# Patient Record
Sex: Female | Born: 1985 | Race: Black or African American | Hispanic: No | Marital: Single | Smoking: Current some day smoker
Health system: Southern US, Community
[De-identification: ages and names within clinical notes are randomized; demographics above are authoritative.]

## PROBLEM LIST (undated history)

## (undated) DIAGNOSIS — K631 Perforation of intestine (nontraumatic): Secondary | ICD-10-CM

## (undated) DIAGNOSIS — K5792 Diverticulitis of intestine, part unspecified, without perforation or abscess without bleeding: Secondary | ICD-10-CM

## (undated) HISTORY — PX: CHOLECYSTECTOMY: SHX55

## (undated) HISTORY — PX: KNEE SURGERY: SHX244

---
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
## 2008-03-23 ENCOUNTER — Emergency Department (HOSPITAL_COMMUNITY): Admission: EM | Admit: 2008-03-23 | Discharge: 2008-03-23 | Payer: Self-pay | Admitting: Emergency Medicine

## 2009-05-01 IMAGING — CR DG CHEST 2V
2 series · 2 of 2 positions shown · non-contrast
Comparison: None

CLINICAL DATA: Cough

CHEST - 2 VIEW

[view not recorded (1 of 2)]
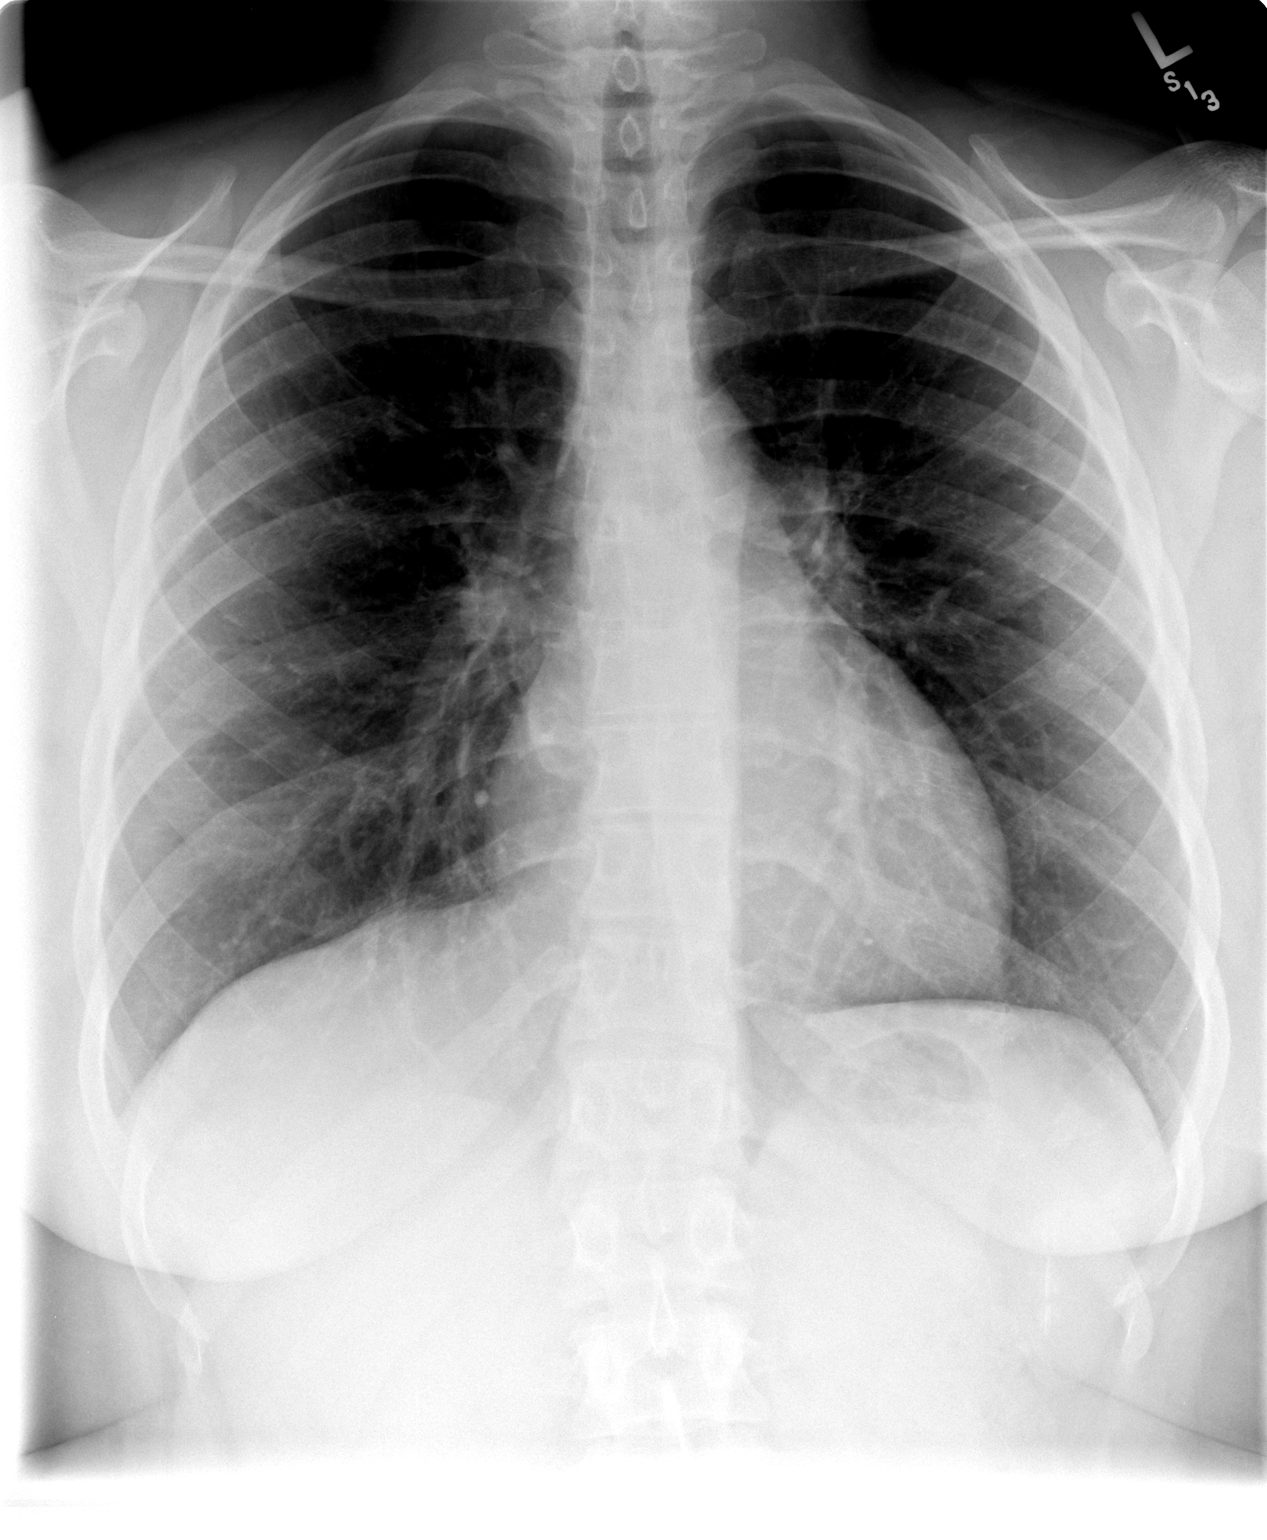

[view not recorded (2 of 2)]
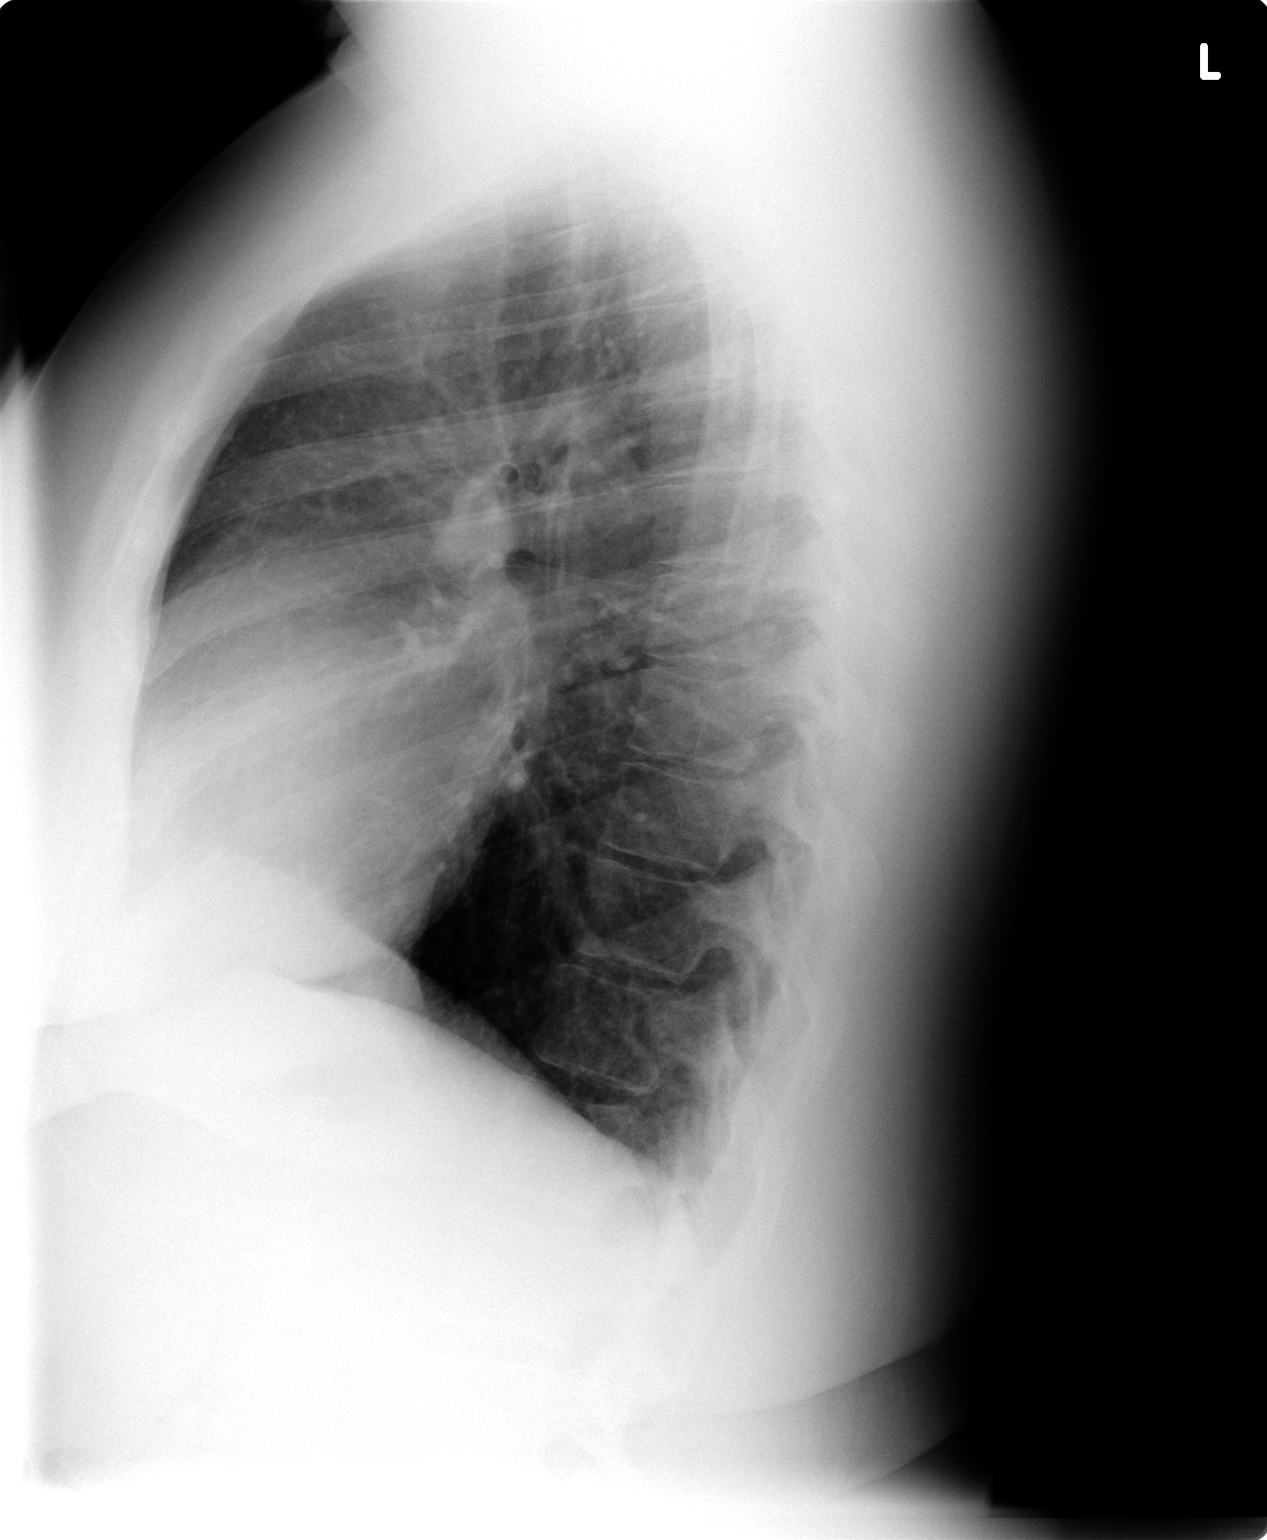

[2 of 2 positions shown; findings below may reference images not displayed]

FINDINGS: The heart size and mediastinal contours are within normal
limits.  Both lungs are clear.
IMPRESSION: No active disease.

## 2010-07-08 ENCOUNTER — Emergency Department (HOSPITAL_COMMUNITY)
Admission: EM | Admit: 2010-07-08 | Discharge: 2010-07-08 | Payer: Self-pay | Source: Home / Self Care | Admitting: Emergency Medicine

## 2010-09-30 ENCOUNTER — Emergency Department (HOSPITAL_COMMUNITY)
Admission: EM | Admit: 2010-09-30 | Discharge: 2010-09-30 | Disposition: A | Discharge status: Home / Self Care | Payer: No Typology Code available for payment source | Attending: Emergency Medicine | Admitting: Emergency Medicine

## 2010-09-30 DIAGNOSIS — Y92410 Unspecified street and highway as the place of occurrence of the external cause: Secondary | ICD-10-CM | POA: Insufficient documentation

## 2010-09-30 DIAGNOSIS — IMO0002 Reserved for concepts with insufficient information to code with codable children: Secondary | ICD-10-CM | POA: Insufficient documentation

## 2011-03-18 ENCOUNTER — Encounter: Payer: Self-pay | Admitting: Emergency Medicine

## 2011-03-18 ENCOUNTER — Emergency Department (HOSPITAL_COMMUNITY)
Admission: EM | Admit: 2011-03-18 | Discharge: 2011-03-18 | Disposition: A | Discharge status: Home / Self Care | Payer: Self-pay | Attending: Emergency Medicine | Admitting: Emergency Medicine

## 2011-03-18 DIAGNOSIS — R51 Headache: Secondary | ICD-10-CM | POA: Insufficient documentation

## 2011-03-18 DIAGNOSIS — K089 Disorder of teeth and supporting structures, unspecified: Secondary | ICD-10-CM | POA: Insufficient documentation

## 2011-03-18 DIAGNOSIS — K0889 Other specified disorders of teeth and supporting structures: Secondary | ICD-10-CM

## 2011-03-18 MED ORDER — HYDROCODONE-ACETAMINOPHEN 5-325 MG PO TABS: 2.0000 | ORAL_TABLET | ORAL | Status: AC | PRN

## 2011-03-18 MED ORDER — AMOXICILLIN 250 MG PO CAPS: 250.0000 mg | ORAL_CAPSULE | Freq: Three times a day (TID) | ORAL | Status: AC

## 2011-03-18 MED ORDER — PERMETHRIN 5 % EX CREA: TOPICAL_CREAM | CUTANEOUS | Status: AC

## 2011-03-18 NOTE — ED Notes (Signed)
Pt c/o left upper/lower toothache since yesterday

## 2011-03-18 NOTE — ED Notes (Signed)
Pt a/ox4. Resp even and unlabored. NAD at this time. D/C instructions reviewed with pt. Pt verbalized understanding. Pt ambulated with steady gate to POV. 

## 2011-03-18 NOTE — ED Provider Notes (Signed)
History     CSN: [id number] Arrival date & time: 03/18/2011 11:34 AM  Chief Complaint  Patient presents with  . Dental Pain   HPI Peggy Rodriguez is a 25 y.o. female who presents to the ED with pain in her upper and lower molars that started a few days ago and has gotten worse. She states she has never had a tooth ache before. The history was provided by the patient.  History reviewed. No pertinent past medical history.  Past Surgical History  Procedure Date  . Cholecystectomy   . Knee surgery     Family History  Problem Relation Age of Onset  . Hypertension Mother   . Coronary artery disease Mother   . Thyroid disease Mother     History  Substance Use Topics  . Smoking status: Current Everyday Smoker -- 0.5 packs/day  . Smokeless tobacco: Not on file  . Alcohol Use: No    OB History    Grav Para Term Preterm Abortions TAB SAB Ect Mult Living                  Review of Systems  Constitutional: Negative for fever, appetite change and fatigue.  HENT: Positive for facial swelling and dental problem. Negative for congestion and neck pain.   Eyes: Negative.   Respiratory: Negative for cough and wheezing.   Cardiovascular: Negative.   Gastrointestinal: Negative for nausea, vomiting and abdominal pain.  Genitourinary: Negative.   Musculoskeletal: Negative for back pain and gait problem.  Skin: Negative for rash and wound.  Neurological: Positive for headaches. Negative for dizziness.    Physical Exam  BP 140/81  Pulse 74  Temp(Src) 98.7 F (37.1 C) (Oral)  Resp 18  SpO2 100%  LMP 02/16/2011  Physical Exam  Nursing note and vitals reviewed. Constitutional: She is oriented to person, place, and time. She appears well-developed and well-nourished. No distress.  HENT:  Head: Atraumatic.  Mouth/Throat: Dental caries present. No uvula swelling. No posterior oropharyngeal erythema or tonsillar abscesses.         Tenderness in the upper and lower second and 3rd  molars with palp.  Eyes: EOM are normal.  Neck: Neck supple.  Pulmonary/Chest: Effort normal.  Abdominal: Soft. There is no tenderness.  Musculoskeletal: Normal range of motion.  Neurological: She is alert and oriented to person, place, and time. No cranial nerve deficit.  Skin: Skin is warm and dry.    ED Course  Procedures  Assessment:  Dental pain   Dental Caries  Plan:  Amoxicillin 250 mg. Po tid x 10 days   Ibuprofen prn   Hydrocodone/ace. 5/325 mg. Po q6 hours # 16   Follow up with dentist ASAP    Kerrie Buffalo, NP 03/18/11 1208  Note: Patient states her friend has just been diagnosed with scabies and pt. Is has noted a similar rash. Request cream to treat herself at the same time friend is treated. Will give Rx for [facility], NP 03/18/11 1316

## 2011-04-07 LAB — STREP A DNA PROBE: Group A Strep Probe: NEGATIVE

## 2011-04-07 LAB — RAPID STREP SCREEN (MED CTR MEBANE ONLY): Streptococcus, Group A Screen (Direct): NEGATIVE

## 2011-07-08 HISTORY — PX: CHOLECYSTECTOMY: SHX55

## 2013-05-07 HISTORY — PX: BARTHOLIN GLAND CYST EXCISION: SHX565

## 2019-04-14 DIAGNOSIS — Z114 Encounter for screening for human immunodeficiency virus [HIV]: Secondary | ICD-10-CM | POA: Diagnosis not present

## 2019-04-14 DIAGNOSIS — Z01419 Encounter for gynecological examination (general) (routine) without abnormal findings: Secondary | ICD-10-CM | POA: Diagnosis not present

## 2019-04-14 DIAGNOSIS — Z113 Encounter for screening for infections with a predominantly sexual mode of transmission: Secondary | ICD-10-CM | POA: Diagnosis not present

## 2019-04-14 DIAGNOSIS — Z0389 Encounter for observation for other suspected diseases and conditions ruled out: Secondary | ICD-10-CM | POA: Diagnosis not present

## 2019-04-14 DIAGNOSIS — Z3009 Encounter for other general counseling and advice on contraception: Secondary | ICD-10-CM | POA: Diagnosis not present

## 2019-04-14 DIAGNOSIS — Z1388 Encounter for screening for disorder due to exposure to contaminants: Secondary | ICD-10-CM | POA: Diagnosis not present

## 2019-04-20 DIAGNOSIS — Z3046 Encounter for surveillance of implantable subdermal contraceptive: Secondary | ICD-10-CM | POA: Diagnosis not present

## 2019-07-18 DIAGNOSIS — R030 Elevated blood-pressure reading, without diagnosis of hypertension: Secondary | ICD-10-CM | POA: Diagnosis not present

## 2019-07-18 DIAGNOSIS — N760 Acute vaginitis: Secondary | ICD-10-CM | POA: Diagnosis not present

## 2019-07-18 DIAGNOSIS — Z113 Encounter for screening for infections with a predominantly sexual mode of transmission: Secondary | ICD-10-CM | POA: Diagnosis not present

## 2019-07-18 DIAGNOSIS — Z3009 Encounter for other general counseling and advice on contraception: Secondary | ICD-10-CM | POA: Diagnosis not present

## 2019-07-18 DIAGNOSIS — Z3046 Encounter for surveillance of implantable subdermal contraceptive: Secondary | ICD-10-CM | POA: Diagnosis not present

## 2019-08-11 ENCOUNTER — Other Ambulatory Visit: Payer: Self-pay

## 2019-08-15 ENCOUNTER — Telehealth: Payer: Self-pay | Admitting: *Deleted

## 2019-08-15 NOTE — Telephone Encounter (Signed)
Pt notified that her covid 19 tests results are not available. She voiced understanding.

## 2019-08-19 ENCOUNTER — Telehealth: Payer: Self-pay | Admitting: General Practice

## 2019-08-19 NOTE — Telephone Encounter (Signed)
Negative COVID results given. Patient results "NOT Detected." Caller expressed understanding. ° °

## 2019-09-15 ENCOUNTER — Ambulatory Visit: Payer: Medicaid Other | Attending: Internal Medicine

## 2019-09-15 DIAGNOSIS — Z23 Encounter for immunization: Secondary | ICD-10-CM

## 2019-09-15 NOTE — Progress Notes (Signed)
   Covid-19 Vaccination Clinic  Name:  WYNDI NORTHRUP    MRN: [id number] DOB: 12-Dec-1985  09/15/2019  Ms. Sheeran was observed post Covid-19 immunization for 15 minutes without incident. She was provided with Vaccine Information Sheet and instruction to access the V-Safe system.   Ms. Weems was instructed to call 911 with any severe reactions post vaccine: Marland Kitchen Difficulty breathing  . Swelling of face and throat  . A fast heartbeat  . A bad rash all over body  . Dizziness and weakness   Immunizations Administered    Name Date Dose VIS Date Route   Moderna COVID-19 Vaccine 09/15/2019 10:19 AM 0.5 mL 06/07/2019 Intramuscular   Manufacturer: Moderna   Lot: [id number]   NDC: 34287-681-15

## 2019-09-20 ENCOUNTER — Encounter (HOSPITAL_COMMUNITY): Payer: Self-pay | Admitting: *Deleted

## 2019-09-20 ENCOUNTER — Emergency Department (HOSPITAL_COMMUNITY)
Admission: EM | Admit: 2019-09-20 | Discharge: 2019-09-20 | Disposition: A | Discharge status: Home / Self Care | Payer: Medicaid Other | Attending: Emergency Medicine | Admitting: Emergency Medicine

## 2019-09-20 ENCOUNTER — Other Ambulatory Visit: Payer: Self-pay

## 2019-09-20 DIAGNOSIS — L732 Hidradenitis suppurativa: Secondary | ICD-10-CM

## 2019-09-20 DIAGNOSIS — F17210 Nicotine dependence, cigarettes, uncomplicated: Secondary | ICD-10-CM | POA: Insufficient documentation

## 2019-09-20 MED ORDER — DOXYCYCLINE HYCLATE 100 MG PO CAPS: 100.0000 mg | ORAL_CAPSULE | Freq: Two times a day (BID) | ORAL | 0 refills | Status: DC

## 2019-09-20 MED ORDER — FLUCONAZOLE 150 MG PO TABS: 150.0000 mg | ORAL_TABLET | Freq: Every day | ORAL | 0 refills | Status: DC

## 2019-09-20 NOTE — ED Triage Notes (Signed)
C/o abscesses under both arms x 2 weeks

## 2019-09-20 NOTE — ED Notes (Signed)
Pt. States they "haven't had to shave their under arms in years".

## 2019-09-20 NOTE — Discharge Instructions (Addendum)
You were seen in the emergency department today for irritation to the area of your armpits.  We did not see an area that looked appropriate to be drained in the emergency department at this time.  We are concerned that you may have something called hidradenitis suppurativa, please see attached handout.  We are starting you on doxycycline, an antibiotic, to treat for infection.  Please take this as prescribed.  We are also sending you home with Diflucan in case she developed a yeast infection.  We have prescribed you new medication(s) today. Discuss the medications prescribed today with your pharmacist as they can have adverse effects and interactions with your other medicines including over the counter and prescribed medications. Seek medical evaluation if you start to experience new or abnormal symptoms after taking one of these medicines, seek care immediately if you start to experience difficulty breathing, feeling of your throat closing, facial swelling, or rash as these could be indications of a more serious allergic reaction  We would like you to follow-up with your primary care provider and/or a dermatologist within the next 3 to 5 days.  We have provided information for local dermatologist.  Return to the ER for new or worsening symptoms including but not limited to increased pain, spreading redness, fever, or any other concerns.  [facility] Dermatologists:   [facility]  3.2 [phone])  Dermatologist  [address] [place] # [place]  [phone]   Dr. Mertha Finders, MD  2.6 [phone])  Dermatologist  [address] [place]  [phone]  [facility]  3.5 (3)  [facility]  [address] [place]  [phone]   [facility]  4.0 (4)  Dermatologist  [address]  [phone]  Janalyn Harder MD  3.0 (2)  Dermatologist  [address]  [phone]  Hoyle Sauer  2.7 (6)  Dermatologist  [address] [place]  [phone]  [place] Amy Y  MD  2.0 (1)  Dermatologist  [address] [place]  [phone]  [facility]  5.0 (3)  Doctor  [address]  [phone]

## 2019-09-20 NOTE — ED Provider Notes (Signed)
[facility] EMERGENCY DEPARTMENT Provider Note   CSN: [id number] Arrival date & time: 09/20/19  1030     History Chief Complaint  Patient presents with  . Abscess    Peggy Rodriguez is a 35 y.o. female with a hx of tobacco abuse who presents to the ED with concern for abscesses to bilateral axillary regions developing over the past 1 week.  Patient states she has multiple areas of discomfort and firmness to the bilateral axillary region, these have developed and progressed in varying stages over the course of the past week.  No alleviating or aggravating factors.  They feel like prior abscesses she has had.  She has had recurrent issues with similar in the past.  She denies fever, chills, nausea, vomiting, or injury to the area.  HPI     History reviewed. No pertinent past medical history.  There are no problems to display for this patient.   Past Surgical History:  Procedure Laterality Date  . CHOLECYSTECTOMY    . KNEE SURGERY       OB History   No obstetric history on file.     Family History  Problem Relation Age of Onset  . Hypertension Mother   . Coronary artery disease Mother   . Thyroid disease Mother     Social History   Tobacco Use  . Smoking status: Current Some Day Smoker    Packs/day: 0.50  . Smokeless tobacco: Never Used  Substance Use Topics  . Alcohol use: No  . Drug use: Not on file    Home Medications Prior to Admission medications   Medication Sig Start Date End Date Taking? Authorizing Provider  nystatin cream (MYCOSTATIN) APPLY PEA SIZE AMOUNT ON THE SKIN 2 3 TIMES PER DAY 08/11/19   [provider]  triamcinolone cream (KENALOG) 0.1 % APPLY TOPICALLY TO AFFECTED AREA TWICE A DAY FOR 2 WEEKS 08/11/19   [provider]    Allergies    Patient has no known allergies.  Review of Systems   Review of Systems  Constitutional: Negative for chills and fever.  Respiratory: Negative for cough and shortness of breath.    Cardiovascular: Negative for chest pain.  Gastrointestinal: Negative for abdominal pain, nausea and vomiting.  Skin: Positive for wound.  Neurological: Negative for weakness and numbness.    Physical Exam Updated Vital Signs BP 113/84   Pulse 78   Temp 98.5 F (36.9 C) (Oral)   Resp 18   SpO2 99%   Physical Exam Vitals and nursing note reviewed.  Constitutional:      General: She is not in acute distress.    Appearance: She is well-developed. She is not toxic-appearing.  HENT:     Head: Normocephalic and atraumatic.  Eyes:     General:        Right eye: No discharge.        Left eye: No discharge.     Conjunctiva/sclera: Conjunctivae normal.  Cardiovascular:     Rate and Rhythm: Normal rate and regular rhythm.     Comments: 2+ symmetric radial pulses. Pulmonary:     Effort: Pulmonary effort is normal. No respiratory distress.     Breath sounds: Normal breath sounds. No wheezing, rhonchi or rales.  Abdominal:     General: There is no distension.     Palpations: Abdomen is soft.     Tenderness: There is no abdominal tenderness.  Musculoskeletal:     Cervical back: Neck supple.  Skin:  General: Skin is warm and dry.     Findings: No rash.     Comments: There are multiple small areas of induration to the bilateral axillas- variable sizes, 1-2 cm each, some with mild overlying erythema, mildly tender to palpation.  No palpable fluctuance.  No active drainage.  Neurological:     Mental Status: She is alert.     Comments: Clear speech.   Psychiatric:        Behavior: Behavior normal.     ED Results / Procedures / Treatments   Labs (all labs ordered are listed, but only abnormal results are displayed) Labs Reviewed - No data to display  EKG None  Radiology No results found.  Procedures Procedures (including critical care time)  Medications Ordered in ED Medications - No data to display  ED Course  I have reviewed the triage vital signs and the nursing  notes.  Pertinent labs & imaging results that were available during my care of the patient were reviewed by me and considered in my medical decision making (see chart for details).    MDM Rules/Calculators/A&P                      Patient presents to the emergency department with concern for bilateral axillary abscesses which have been developing over the past 1 week.  She is nontoxic, resting comfortably, vitals WNL.  She has multiple small areas of induration and tenderness to palpation, some with mild overlying erythema.  No streaking erythema.  No palpable fluctuance, utilize bedside ultrasound without obvious large drainable fluid collection amenable to emergency department incision and drainage.  Given recurrence and degree of current presentation there is concern for hidradenitis suppurativa.  Will start patient on doxycycline and provide information for dermatology follow-up.  Recommended warm compresses.  She states that she frequently gets yeast infections with doxycycline, will prescribe one-time dose of Diflucan as well. I discussed results, treatment plan, need for follow-up, and return precautions with the patient. Provided opportunity for questions, patient confirmed understanding and is in agreement with plan.   Final Clinical Impression(s) / ED Diagnoses Final diagnoses:  Hidradenitis suppurativa    Rx / DC Orders ED Discharge Orders         Ordered    doxycycline (VIBRAMYCIN) 100 MG capsule  2 times daily     09/20/19 1254    fluconazole (DIFLUCAN) 150 MG tablet  Daily     09/20/19 1254           Nycole Kawahara, Glynda Jaeger, PA-C 09/20/19 [place], Ankit, MD 09/20/19 1421

## 2019-09-27 ENCOUNTER — Ambulatory Visit: Payer: Self-pay | Admitting: Family Medicine

## 2019-10-18 ENCOUNTER — Ambulatory Visit: Payer: Medicaid Other | Attending: Internal Medicine

## 2019-10-18 DIAGNOSIS — Z23 Encounter for immunization: Secondary | ICD-10-CM

## 2019-10-18 NOTE — Progress Notes (Signed)
   Covid-19 Vaccination Clinic  Name:  Peggy Rodriguez    MRN: [id number] DOB: [birth year]  10/18/2019  [name] was observed post Covid-19 immunization for 15 minutes without incident. She was provided with Vaccine Information Sheet and instruction to access the V-Safe system.   [name] was instructed to call 911 with any severe reactions post vaccine: Marland Kitchen Difficulty breathing  . Swelling of face and throat  . A fast heartbeat  . A bad rash all over body  . Dizziness and weakness   Immunizations Administered    Name Date Dose VIS Date Route   Moderna COVID-19 Vaccine 10/18/2019 10:36 AM 0.5 mL 06/07/2019 Intramuscular   Manufacturer: Moderna   Lot: [id number]   NDC: 73532-992-42

## 2020-01-05 DIAGNOSIS — Z419 Encounter for procedure for purposes other than remedying health state, unspecified: Secondary | ICD-10-CM | POA: Diagnosis not present

## 2020-01-31 ENCOUNTER — Encounter (HOSPITAL_COMMUNITY): Payer: Self-pay | Admitting: *Deleted

## 2020-01-31 ENCOUNTER — Emergency Department (HOSPITAL_COMMUNITY)
Admission: EM | Admit: 2020-01-31 | Discharge: 2020-01-31 | Disposition: A | Discharge status: Home / Self Care | Payer: Medicaid Other | Attending: Emergency Medicine | Admitting: Emergency Medicine

## 2020-01-31 ENCOUNTER — Other Ambulatory Visit: Payer: Self-pay

## 2020-01-31 DIAGNOSIS — R109 Unspecified abdominal pain: Secondary | ICD-10-CM | POA: Insufficient documentation

## 2020-01-31 DIAGNOSIS — Z87891 Personal history of nicotine dependence: Secondary | ICD-10-CM | POA: Insufficient documentation

## 2020-01-31 DIAGNOSIS — R112 Nausea with vomiting, unspecified: Secondary | ICD-10-CM | POA: Diagnosis not present

## 2020-01-31 DIAGNOSIS — R1110 Vomiting, unspecified: Secondary | ICD-10-CM | POA: Diagnosis present

## 2020-01-31 DIAGNOSIS — R531 Weakness: Secondary | ICD-10-CM | POA: Insufficient documentation

## 2020-01-31 LAB — DIFFERENTIAL
Abs Immature Granulocytes: 0.02 10*3/uL (ref 0.00–0.07)
Basophils Absolute: 0 10*3/uL (ref 0.0–0.1)
Basophils Relative: 0 %
Eosinophils Absolute: 0 10*3/uL (ref 0.0–0.5)
Eosinophils Relative: 1 %
Immature Granulocytes: 0 %
Lymphocytes Relative: 20 %
Lymphs Abs: 1.3 10*3/uL (ref 0.7–4.0)
Monocytes Absolute: 0.4 10*3/uL (ref 0.1–1.0)
Monocytes Relative: 6 %
Neutro Abs: 4.8 10*3/uL (ref 1.7–7.7)
Neutrophils Relative %: 73 %

## 2020-01-31 LAB — URINALYSIS, ROUTINE W REFLEX MICROSCOPIC
Bilirubin Urine: NEGATIVE
Glucose, UA: NEGATIVE mg/dL
Hgb urine dipstick: NEGATIVE
Ketones, ur: 20 mg/dL — AB
Leukocytes,Ua: NEGATIVE
Nitrite: NEGATIVE
Protein, ur: NEGATIVE mg/dL
Specific Gravity, Urine: 1.019 (ref 1.005–1.030)
pH: 5 (ref 5.0–8.0)

## 2020-01-31 LAB — COMPREHENSIVE METABOLIC PANEL
ALT: 18 U/L (ref 0–44)
AST: 14 U/L — ABNORMAL LOW (ref 15–41)
Albumin: 3.9 g/dL (ref 3.5–5.0)
Alkaline Phosphatase: 67 U/L (ref 38–126)
Anion gap: 10 (ref 5–15)
BUN: 9 mg/dL (ref 6–20)
CO2: 24 mmol/L (ref 22–32)
Calcium: 8.8 mg/dL — ABNORMAL LOW (ref 8.9–10.3)
Chloride: 102 mmol/L (ref 98–111)
Creatinine, Ser: 0.62 mg/dL (ref 0.44–1.00)
GFR calc Af Amer: >60 mL/min (ref >60)
GFR calc non Af Amer: >60 mL/min (ref >60)
Glucose, Bld: 98 mg/dL (ref 70–99)
Potassium: 3.3 mmol/L — ABNORMAL LOW (ref 3.5–5.1)
Sodium: 136 mmol/L (ref 135–145)
Total Bilirubin: 0.8 mg/dL (ref 0.3–1.2)
Total Protein: 7.7 g/dL (ref 6.5–8.1)

## 2020-01-31 LAB — CBC
HCT: 40.2 % (ref 36.0–46.0)
Hemoglobin: 12.4 g/dL (ref 12.0–15.0)
MCH: 27.1 pg (ref 26.0–34.0)
MCHC: 30.8 g/dL (ref 30.0–36.0)
MCV: 88 fL (ref 80.0–100.0)
Platelets: 301 10*3/uL (ref 150–400)
RBC: 4.57 MIL/uL (ref 3.87–5.11)
RDW: 16 % — ABNORMAL HIGH (ref 11.5–15.5)
WBC: 6.5 10*3/uL (ref 4.0–10.5)
nRBC: 0 % (ref 0.0–0.2)

## 2020-01-31 LAB — LIPASE, BLOOD: Lipase: 20 U/L (ref 11–51)

## 2020-01-31 LAB — POC URINE PREG, ED: Preg Test, Ur: NEGATIVE

## 2020-01-31 MED ORDER — ONDANSETRON HCL 4 MG/2ML IJ SOLN
4.0000 mg | Freq: Once | INTRAMUSCULAR | Status: AC
  Administered 2020-01-31: 4 mg via INTRAVENOUS
  Filled 2020-01-31: qty 2

## 2020-01-31 MED ORDER — SODIUM CHLORIDE 0.9% FLUSH: 3.0000 mL | Freq: Once | INTRAVENOUS | Status: DC

## 2020-01-31 MED ORDER — ONDANSETRON HCL 4 MG PO TABS
4.0000 mg | ORAL_TABLET | Freq: Four times a day (QID) | ORAL | 0 refills | Status: DC
Start: 2020-01-31 — End: 2020-03-08

## 2020-01-31 NOTE — ED Triage Notes (Signed)
Nausea and vomitng

## 2020-01-31 NOTE — Discharge Instructions (Signed)
Your nausea and vomiting is likely related to a viral illness.  It is important that you get plenty of rest and drink small, frequent sips of clear fluids this evening.  Bland diet as tolerated starting tomorrow.  Follow-up with your primary doctor for recheck.  Return to emergency department if you develop any worsening symptoms such as fever, increasing abdominal pain or persistent vomiting or diarrhea.

## 2020-01-31 NOTE — ED Provider Notes (Signed)
[facility] EMERGENCY DEPARTMENT Provider Note   CSN: [id number] Arrival date & time: 01/31/20  1042     History Chief Complaint  Patient presents with  . Emesis  . Diarrhea    Peggy Rodriguez is a 34 y.o. female.  HPI      Peggy Rodriguez is a 33 y.o. female who presents to the Emergency Department complaining of sudden onset of nausea vomiting since Sunday.  Reports multiple episodes of vomiting and unable to tolerate food or liquids.  Symptoms are associated with generalized body aches and weakness.  States she feels dehydrated.  Symptoms began after babysitting for her niece who has similar symptoms.  She also reports some crampy lower abdominal pain associated with vomiting only.  No fever or chills, denies dysuria, chest pain, shortness of breath, diarrhea.  She denies known Covid exposures states she has had both vaccines.  History reviewed. No pertinent past medical history.  There are no problems to display for this patient.   Past Surgical History:  Procedure Laterality Date  . CHOLECYSTECTOMY    . KNEE SURGERY       OB History   No obstetric history on file.     Family History  Problem Relation Age of Onset  . Hypertension Mother   . Coronary artery disease Mother   . Thyroid disease Mother     Social History   Tobacco Use  . Smoking status: Former Smoker    Packs/day: 0.50  . Smokeless tobacco: Never Used  Substance Use Topics  . Alcohol use: No  . Drug use: Not Currently    Home Medications Prior to Admission medications   Medication Sig Start Date End Date Taking? Authorizing Provider  doxycycline (VIBRAMYCIN) 100 MG capsule Take 1 capsule (100 mg total) by mouth 2 (two) times daily. 09/20/19   Petrucelli, Samantha R, PA-C  fluconazole (DIFLUCAN) 150 MG tablet Take 1 tablet (150 mg total) by mouth daily. Take 1 tablet as needed for yeast infection symptoms 09/20/19   Petrucelli, Lelon Mast R, PA-C  nystatin cream (MYCOSTATIN) APPLY PEA SIZE AMOUNT  ON THE SKIN 2 3 TIMES PER DAY 08/11/19   [provider]  triamcinolone cream (KENALOG) 0.1 % APPLY TOPICALLY TO AFFECTED AREA TWICE A DAY FOR 2 WEEKS 08/11/19   [provider]    Allergies    Patient has no known allergies.  Review of Systems   Review of Systems  Constitutional: Positive for appetite change. Negative for chills and fever.  HENT: Negative for sore throat and trouble swallowing.   Respiratory: Negative for chest tightness and shortness of breath.   Cardiovascular: Negative for chest pain.  Gastrointestinal: Positive for abdominal pain, nausea and vomiting. Negative for blood in stool and diarrhea.  Genitourinary: Negative for decreased urine volume, difficulty urinating, dysuria and flank pain.  Musculoskeletal: Negative for back pain.  Skin: Negative for color change and rash.  Neurological: Positive for weakness. Negative for dizziness, syncope, facial asymmetry, numbness and headaches.  Hematological: Negative for adenopathy.    Physical Exam Updated Vital Signs BP 125/76 (BP Location: Right Arm)   Pulse 87   Temp 99.5 F (37.5 C) (Oral)   Resp 16   Ht 5\' 10"  (1.778 m)   Wt (!) 140.2 kg   SpO2 100%   BMI 44.34 kg/m   Physical Exam Vitals and nursing note reviewed.  Constitutional:      General: She is not in acute distress.    Appearance: Normal appearance.  HENT:     Mouth/Throat:     Pharynx: No oropharyngeal exudate or posterior oropharyngeal erythema.     Comments: Mucous membranes are slightly dry. Eyes:     Conjunctiva/sclera: Conjunctivae normal.     Pupils: Pupils are equal, round, and reactive to light.  Cardiovascular:     Rate and Rhythm: Normal rate and regular rhythm.     Pulses: Normal pulses.  Pulmonary:     Effort: Pulmonary effort is normal.     Breath sounds: Normal breath sounds.  Abdominal:     General: There is no distension.     Palpations: Abdomen is soft.     Tenderness: There is no abdominal tenderness.  There is no right CVA tenderness, left CVA tenderness or guarding.  Musculoskeletal:     Cervical back: Normal range of motion. No tenderness.     Right lower leg: No edema.     Left lower leg: No edema.  Lymphadenopathy:     Cervical: No cervical adenopathy.  Skin:    General: Skin is warm.     Capillary Refill: Capillary refill takes less than 2 seconds.     Findings: No erythema or rash.  Neurological:     General: No focal deficit present.     Mental Status: She is alert.     Sensory: No sensory deficit.     Motor: No weakness.     ED Results / Procedures / Treatments   Labs (all labs ordered are listed, but only abnormal results are displayed) Labs Reviewed  COMPREHENSIVE METABOLIC PANEL - Abnormal; Notable for the following components:      Result Value   Potassium 3.3 (*)    Calcium 8.8 (*)    AST 14 (*)    All other components within normal limits  CBC - Abnormal; Notable for the following components:   RDW 16.0 (*)    All other components within normal limits  URINALYSIS, ROUTINE W REFLEX MICROSCOPIC - Abnormal; Notable for the following components:   Ketones, ur 20 (*)    All other components within normal limits  LIPASE, BLOOD  DIFFERENTIAL  POC URINE PREG, ED    EKG None  Radiology No results found.  Procedures Procedures (including critical care time)  Medications Ordered in ED Medications  sodium chloride flush (NS) 0.9 % injection 3 mL (has no administration in time range)  ondansetron (ZOFRAN) injection 4 mg (4 mg Intravenous Given 01/31/20 1507)    ED Course  I have reviewed the triage vital signs and the nursing notes.  Pertinent labs & imaging results that were available during my care of the patient were reviewed by me and considered in my medical decision making (see chart for details).  Clinical Course as of Jan 31 1647  Tue Jan 31, 2020  1347 Hemoglobin: 12.4 [AS]    Clinical Course User Index [AS] Ardith Dark    MDM Rules/Calculators/A&P                          On recheck, patient reports feeling better.  No further vomiting after antiemetic and IV normal saline.  Abdomen is soft and nontender on exam.  no peritoneal signs.  Doubt acute surgical abdomen.  Symptoms likely related to viral illness.  Vitals reviewed by me and reassuring.  On recheck, patient has tolerated oral fluid challenge and reports feeling better and ready for discharge home.  Laboratory studies are reassuring and unremarkable.  Patient had recent sick contact, symptoms likely viral.  She was given strict return precautions and agrees to plan.   Final Clinical Impression(s) / ED Diagnoses Final diagnoses:  Non-intractable vomiting with nausea, unspecified vomiting type    Rx / DC Orders ED Discharge Orders    None       Pauline Aus, PA-C 01/31/20 1705    Blane Ohara, MD 02/01/20 1540

## 2020-02-05 DIAGNOSIS — Z419 Encounter for procedure for purposes other than remedying health state, unspecified: Secondary | ICD-10-CM | POA: Diagnosis not present

## 2020-02-26 ENCOUNTER — Other Ambulatory Visit: Payer: Self-pay

## 2020-02-26 ENCOUNTER — Emergency Department (HOSPITAL_COMMUNITY): Payer: Medicaid Other

## 2020-02-26 ENCOUNTER — Inpatient Hospital Stay (HOSPITAL_COMMUNITY)
Admission: EM | Admit: 2020-02-26 | Discharge: 2020-02-29 | DRG: 392 | Disposition: A | Discharge status: Home / Self Care | Payer: Medicaid Other | Attending: Internal Medicine | Admitting: Internal Medicine

## 2020-02-26 ENCOUNTER — Encounter (HOSPITAL_COMMUNITY): Payer: Self-pay | Admitting: *Deleted

## 2020-02-26 DIAGNOSIS — Z20822 Contact with and (suspected) exposure to covid-19: Secondary | ICD-10-CM | POA: Diagnosis present

## 2020-02-26 DIAGNOSIS — Z8249 Family history of ischemic heart disease and other diseases of the circulatory system: Secondary | ICD-10-CM

## 2020-02-26 DIAGNOSIS — K5720 Diverticulitis of large intestine with perforation and abscess without bleeding: Principal | ICD-10-CM | POA: Diagnosis present

## 2020-02-26 DIAGNOSIS — R630 Anorexia: Secondary | ICD-10-CM | POA: Diagnosis present

## 2020-02-26 DIAGNOSIS — R198 Other specified symptoms and signs involving the digestive system and abdomen: Secondary | ICD-10-CM

## 2020-02-26 DIAGNOSIS — L539 Erythematous condition, unspecified: Secondary | ICD-10-CM | POA: Diagnosis not present

## 2020-02-26 DIAGNOSIS — L732 Hidradenitis suppurativa: Secondary | ICD-10-CM

## 2020-02-26 DIAGNOSIS — Z6841 Body Mass Index (BMI) 40.0 and over, adult: Secondary | ICD-10-CM | POA: Diagnosis not present

## 2020-02-26 DIAGNOSIS — E876 Hypokalemia: Secondary | ICD-10-CM | POA: Diagnosis not present

## 2020-02-26 DIAGNOSIS — K5792 Diverticulitis of intestine, part unspecified, without perforation or abscess without bleeding: Secondary | ICD-10-CM

## 2020-02-26 DIAGNOSIS — E860 Dehydration: Secondary | ICD-10-CM | POA: Diagnosis not present

## 2020-02-26 DIAGNOSIS — Z9049 Acquired absence of other specified parts of digestive tract: Secondary | ICD-10-CM

## 2020-02-26 DIAGNOSIS — Z87891 Personal history of nicotine dependence: Secondary | ICD-10-CM

## 2020-02-26 DIAGNOSIS — K219 Gastro-esophageal reflux disease without esophagitis: Secondary | ICD-10-CM | POA: Diagnosis not present

## 2020-02-26 DIAGNOSIS — Z8349 Family history of other endocrine, nutritional and metabolic diseases: Secondary | ICD-10-CM | POA: Diagnosis not present

## 2020-02-26 DIAGNOSIS — R1110 Vomiting, unspecified: Secondary | ICD-10-CM | POA: Diagnosis not present

## 2020-02-26 DIAGNOSIS — K5730 Diverticulosis of large intestine without perforation or abscess without bleeding: Secondary | ICD-10-CM | POA: Diagnosis not present

## 2020-02-26 DIAGNOSIS — K631 Perforation of intestine (nontraumatic): Secondary | ICD-10-CM | POA: Diagnosis not present

## 2020-02-26 DIAGNOSIS — E66813 Obesity, class 3: Secondary | ICD-10-CM | POA: Diagnosis present

## 2020-02-26 DIAGNOSIS — K5900 Constipation, unspecified: Secondary | ICD-10-CM | POA: Diagnosis not present

## 2020-02-26 DIAGNOSIS — Z79899 Other long term (current) drug therapy: Secondary | ICD-10-CM | POA: Diagnosis not present

## 2020-02-26 DIAGNOSIS — E871 Hypo-osmolality and hyponatremia: Secondary | ICD-10-CM | POA: Diagnosis not present

## 2020-02-26 LAB — COMPREHENSIVE METABOLIC PANEL
ALT: 15 U/L (ref 0–44)
AST: 12 U/L — ABNORMAL LOW (ref 15–41)
Albumin: 3.7 g/dL (ref 3.5–5.0)
Alkaline Phosphatase: 64 U/L (ref 38–126)
Anion gap: 10 (ref 5–15)
BUN: 5 mg/dL — ABNORMAL LOW (ref 6–20)
CO2: 21 mmol/L — ABNORMAL LOW (ref 22–32)
Calcium: 8.8 mg/dL — ABNORMAL LOW (ref 8.9–10.3)
Chloride: 102 mmol/L (ref 98–111)
Creatinine, Ser: 0.59 mg/dL (ref 0.44–1.00)
GFR calc Af Amer: >60 mL/min (ref >60)
GFR calc non Af Amer: >60 mL/min (ref >60)
Glucose, Bld: 103 mg/dL — ABNORMAL HIGH (ref 70–99)
Potassium: 3.2 mmol/L — ABNORMAL LOW (ref 3.5–5.1)
Sodium: 133 mmol/L — ABNORMAL LOW (ref 135–145)
Total Bilirubin: 1.1 mg/dL (ref 0.3–1.2)
Total Protein: 7.3 g/dL (ref 6.5–8.1)

## 2020-02-26 LAB — URINALYSIS, ROUTINE W REFLEX MICROSCOPIC
Bacteria, UA: NONE SEEN
Bilirubin Urine: NEGATIVE
Glucose, UA: NEGATIVE mg/dL
Ketones, ur: 20 mg/dL — AB
Leukocytes,Ua: NEGATIVE
Nitrite: NEGATIVE
Protein, ur: NEGATIVE mg/dL
Specific Gravity, Urine: 1.009 (ref 1.005–1.030)
pH: 7 (ref 5.0–8.0)

## 2020-02-26 LAB — SARS CORONAVIRUS 2 BY RT PCR (HOSPITAL ORDER, PERFORMED IN ~~LOC~~ HOSPITAL LAB): SARS Coronavirus 2: NEGATIVE

## 2020-02-26 LAB — CBC WITH DIFFERENTIAL/PLATELET
Abs Immature Granulocytes: 0.05 10*3/uL (ref 0.00–0.07)
Basophils Absolute: 0 10*3/uL (ref 0.0–0.1)
Basophils Relative: 0 %
Eosinophils Absolute: 0 10*3/uL (ref 0.0–0.5)
Eosinophils Relative: 0 %
HCT: 36.9 % (ref 36.0–46.0)
Hemoglobin: 11.5 g/dL — ABNORMAL LOW (ref 12.0–15.0)
Immature Granulocytes: 0 %
Lymphocytes Relative: 15 %
Lymphs Abs: 2.4 10*3/uL (ref 0.7–4.0)
MCH: 27.1 pg (ref 26.0–34.0)
MCHC: 31.2 g/dL (ref 30.0–36.0)
MCV: 86.8 fL (ref 80.0–100.0)
Monocytes Absolute: 1.2 10*3/uL — ABNORMAL HIGH (ref 0.1–1.0)
Monocytes Relative: 8 %
Neutro Abs: 11.9 10*3/uL — ABNORMAL HIGH (ref 1.7–7.7)
Neutrophils Relative %: 77 %
Platelets: 309 10*3/uL (ref 150–400)
RBC: 4.25 MIL/uL (ref 3.87–5.11)
RDW: 15.9 % — ABNORMAL HIGH (ref 11.5–15.5)
WBC: 15.6 10*3/uL — ABNORMAL HIGH (ref 4.0–10.5)
nRBC: 0 % (ref 0.0–0.2)

## 2020-02-26 LAB — PREGNANCY, URINE: Preg Test, Ur: NEGATIVE

## 2020-02-26 MED ORDER — PIPERACILLIN-TAZOBACTAM 3.375 G IVPB 30 MIN
3.3750 g | Freq: Once | INTRAVENOUS | Status: AC
  Administered 2020-02-26: 3.375 g via INTRAVENOUS
  Filled 2020-02-26: qty 50

## 2020-02-26 MED ORDER — IBUPROFEN 800 MG PO TABS
800.0000 mg | ORAL_TABLET | Freq: Once | ORAL | Status: AC
  Administered 2020-02-26: 800 mg via ORAL
  Filled 2020-02-26: qty 1

## 2020-02-26 MED ORDER — HEPARIN SODIUM (PORCINE) 5000 UNIT/ML IJ SOLN
5000.0000 [IU] | Freq: Three times a day (TID) | INTRAMUSCULAR | Status: DC
  Administered 2020-02-26 – 2020-02-29 (×6): 5000 [IU] via SUBCUTANEOUS
  Filled 2020-02-26 (×8): qty 1

## 2020-02-26 MED ORDER — ONDANSETRON HCL 4 MG/2ML IJ SOLN
4.0000 mg | Freq: Four times a day (QID) | INTRAMUSCULAR | Status: DC | PRN
  Administered 2020-02-26 – 2020-02-28 (×4): 4 mg via INTRAVENOUS
  Filled 2020-02-26 (×4): qty 2

## 2020-02-26 MED ORDER — SODIUM CHLORIDE 0.9 % IV SOLN: INTRAVENOUS | Status: DC

## 2020-02-26 MED ORDER — MORPHINE SULFATE (PF) 2 MG/ML IV SOLN
2.0000 mg | INTRAVENOUS | Status: DC | PRN
  Administered 2020-02-26 – 2020-02-28 (×11): 2 mg via INTRAVENOUS
  Filled 2020-02-26 (×12): qty 1

## 2020-02-26 MED ORDER — POTASSIUM CHLORIDE 2 MEQ/ML IV SOLN: INTRAVENOUS | Status: DC

## 2020-02-26 MED ORDER — PANTOPRAZOLE SODIUM 40 MG IV SOLR
40.0000 mg | INTRAVENOUS | Status: DC
  Administered 2020-02-26 – 2020-02-28 (×3): 40 mg via INTRAVENOUS
  Filled 2020-02-26 (×4): qty 40

## 2020-02-26 MED ORDER — ONDANSETRON HCL 4 MG/2ML IJ SOLN
4.0000 mg | Freq: Once | INTRAMUSCULAR | Status: AC
  Administered 2020-02-26: 4 mg via INTRAVENOUS
  Filled 2020-02-26: qty 2

## 2020-02-26 MED ORDER — PIPERACILLIN-TAZOBACTAM 3.375 G IVPB
3.3750 g | Freq: Three times a day (TID) | INTRAVENOUS | Status: DC
  Administered 2020-02-26 – 2020-02-29 (×9): 3.375 g via INTRAVENOUS
  Filled 2020-02-26 (×10): qty 50

## 2020-02-26 MED ORDER — IOHEXOL 300 MG/ML  SOLN
100.0000 mL | Freq: Once | INTRAMUSCULAR | Status: AC | PRN
  Administered 2020-02-26: 100 mL via INTRAVENOUS

## 2020-02-26 MED ORDER — KCL IN DEXTROSE-NACL 40-5-0.45 MEQ/L-%-% IV SOLN
INTRAVENOUS | Status: DC
  Filled 2020-02-26 (×3): qty 1000

## 2020-02-26 MED ORDER — SODIUM CHLORIDE 0.9 % IV SOLN: INTRAVENOUS | Status: AC

## 2020-02-26 NOTE — H&P (Signed)
History and Physical    Peggy Rodriguez NGE:[id number] DOB: 1986/04/27 DOA: 02/26/2020  PCP: [facility]  Patient coming from: Home  I have personally briefly reviewed patient's old medical records in [facility]  Chief Complaint: Abdominal pain, nausea and vomiting  HPI: Peggy Rodriguez is a 34 y.o. female with medical history significant of with past medical history significant for prior history of cholecystectomy, knee osteoarthritis and morbid obesity; who presented to the hospital secondary to abdominal pain, nausea and vomiting.  Patient reports symptoms started approximately 3-4 days and worsening.  She expressed pain in the lower aspect of her abdomen, 7-8 out of 10 in intensity, crampy in nature, associated with poor appetite, nausea, dry heaving/vomiting and constipation.  Patient reports pain is radiated to the lower aspect of her left back. Patient denies dysuria, hematuria, melena, hematochezia, hematemesis, fever, chills, headaches, focal neurologic deficits or any other complaints.  No sick contacts.  Covid test check in the ED and negative.  ED Course: CT abdomen and pelvis demonstrated sigmoid diverticulitis with contained perforation, mild hyponatremia/hypokalemia and elevated WBCs appreciated on the blood work.  Patient received IV fluids, IV antibiotics [facility] has been contacted for further evaluation and management.  General surgery service was also called at time of admission.  Review of Systems: As per HPI otherwise all other systems reviewed and are negative.   History reviewed. No pertinent past medical history.  Past Surgical History:  Procedure Laterality Date  . CHOLECYSTECTOMY    . KNEE SURGERY      Social History  reports that she has quit smoking. She smoked 0.50 packs per day. She has never used smokeless tobacco. She reports previous drug use. She reports that she does not drink alcohol.  No Known Allergies  Family History    Problem Relation Age of Onset  . Hypertension Mother   . Coronary artery disease Mother   . Thyroid disease Mother     Prior to Admission medications   Medication Sig Start Date End Date Taking? Authorizing Provider  etonogestrel (NEXPLANON) 68 MG IMPL implant 1 each by Subdermal route once.   Yes [provider]  ibuprofen (ADVIL) 800 MG tablet Take 800 mg by mouth every 6 (six) hours as needed.   Yes [provider]  naproxen sodium (ALEVE) 220 MG tablet Take 220 mg by mouth daily as needed.   Yes [provider]  doxycycline (VIBRAMYCIN) 100 MG capsule Take 1 capsule (100 mg total) by mouth 2 (two) times daily. Patient not taking: Reported on 02/26/2020 09/20/19   Petrucelli, Pleas Koch, PA-C  fluconazole (DIFLUCAN) 150 MG tablet Take 1 tablet (150 mg total) by mouth daily. Take 1 tablet as needed for yeast infection symptoms Patient not taking: Reported on 02/26/2020 09/20/19   Petrucelli, Lelon Mast R, PA-C  nystatin cream (MYCOSTATIN) APPLY PEA SIZE AMOUNT ON THE SKIN 2 3 TIMES PER DAY Patient not taking: Reported on 02/26/2020 08/11/19   [provider]  ondansetron (ZOFRAN) 4 MG tablet Take 1 tablet (4 mg total) by mouth every 6 (six) hours. As needed for nausea vomiting Patient not taking: Reported on 02/26/2020 01/31/20   Triplett, Tammy, PA-C  triamcinolone cream (KENALOG) 0.1 % APPLY TOPICALLY TO AFFECTED AREA TWICE A DAY FOR 2 WEEKS Patient not taking: Reported on 02/26/2020 08/11/19   [provider]    Physical Exam: Vitals:   02/26/20 1400 02/26/20 1430 02/26/20 1528 02/26/20 1529  BP: 113/64 121/68 125/82   Pulse:  81 (!) 101 94   Resp:  18 19   Temp:   98.8 F (37.1 C)   TempSrc:   Oral   SpO2: 97% 100% 95%   Weight:    134.6 kg  Height:    5\' 11"  (1.803 m)    Constitutional: Afebrile, no chest pain, no SOB. No major distress.  Vitals:   02/26/20 1400 02/26/20 1430 02/26/20 1528 02/26/20 1529  BP: 113/64 121/68 125/82   Pulse:  81 (!) 101 94   Resp:  18 19   Temp:   98.8 F (37.1 C)   TempSrc:   Oral   SpO2: 97% 100% 95%   Weight:    134.6 kg  Height:    5\' 11"  (1.803 m)   Eyes: PERRL, lids and conjunctivae normal, no icterus, no nystagmus. ENMT: Mucous membranes are moist. Posterior pharynx clear of any exudate or lesions. Neck: normal, supple, no masses, no thyromegaly Respiratory: clear to auscultation bilaterally, no wheezing, no crackles. Normal respiratory effort. No accessory muscle use.  Good oxygen saturation on room air. Cardiovascular: Borderline sinus tachycardia, no rubs, no gallops, no murmurs, no JVD.   Abdomen: Tender to palpation in her left lower quadrant, no guarding, soft, positive bowel sounds.  No distention appreciated.   Musculoskeletal: no clubbing / cyanosis. No joint deformity upper and lower extremities. Good ROM, no contractures. Normal muscle tone.  Skin: no rashes, no petechiae. Neurologic: CN 2-12 grossly intact. Sensation intact, DTR normal. Strength 5/5 in all 4.  Psychiatric: Normal judgment and insight. Alert and oriented x 3. Normal mood.    Labs on Admission: I have personally reviewed following labs and imaging studies  CBC: Recent Labs  Lab 02/26/20 0839  WBC 15.6*  NEUTROABS 11.9*  HGB 11.5*  HCT 36.9  MCV 86.8  PLT 309    Basic Metabolic Panel: Recent Labs  Lab 02/26/20 0839  NA 133*  K 3.2*  CL 102  CO2 21*  GLUCOSE 103*  BUN 5*  CREATININE 0.59  CALCIUM 8.8*    GFR: Estimated Creatinine Clearance: 152.1 mL/min (by C-G formula based on SCr of 0.59 mg/dL).  Liver Function Tests: Recent Labs  Lab 02/26/20 0839  AST 12*  ALT 15  ALKPHOS 64  BILITOT 1.1  PROT 7.3  ALBUMIN 3.7    Urine analysis:    Component Value Date/Time   COLORURINE YELLOW 02/26/2020 0754   APPEARANCEUR CLEAR 02/26/2020 0754   LABSPEC 1.009 02/26/2020 0754   PHURINE 7.0 02/26/2020 0754   GLUCOSEU NEGATIVE 02/26/2020 0754   HGBUR SMALL (A) 02/26/2020 0754    BILIRUBINUR NEGATIVE 02/26/2020 0754   KETONESUR 20 (A) 02/26/2020 0754   PROTEINUR NEGATIVE 02/26/2020 0754   NITRITE NEGATIVE 02/26/2020 0754   LEUKOCYTESUR NEGATIVE 02/26/2020 0754    Radiological Exams on Admission: CT ABDOMEN PELVIS W CONTRAST  Result Date: 02/26/2020 CLINICAL DATA:  Lower abdominal pain with swelling and constipation. Nausea and vomiting for 3 days. EXAM: CT ABDOMEN AND PELVIS WITH CONTRAST TECHNIQUE: Multidetector CT imaging of the abdomen and pelvis was performed using the standard protocol following bolus administration of intravenous contrast. CONTRAST:  02/28/2020 OMNIPAQUE IOHEXOL 300 MG/ML  SOLN COMPARISON:  None. FINDINGS: Lower chest:  No contributory findings. Hepatobiliary: No focal liver abnormality.Cholecystectomy. Pancreas: Unremarkable. Spleen: Unremarkable. Adrenals/Urinary Tract: Negative adrenals. No hydronephrosis or stone. Unremarkable bladder. Stomach/Bowel: Left colonic diverticulosis with inflammation around a sigmoid diverticulum, complicated by extraluminal gas with a 17 mm span. No obstruction. No appendicitis. Vascular/Lymphatic: No acute vascular  abnormality. No mass or adenopathy. Reproductive:No pathologic findings. Other: No ascites or pneumoperitoneum. Musculoskeletal: No acute abnormalities. Lower lumbar facet degeneration. IMPRESSION: Sigmoid diverticulitis complicated by contained perforation (local mesenteric gas without fluid collection). Electronically Signed   By: Marnee Spring M.D.   On: 02/26/2020 09:46    Assessment/Plan 1-diverticulitis of colon with perforation -N.p.o. for bowel rest. -Started on IV Zosyn -Fluid resuscitation, antiemetics and as needed analgesics. -Replete electrolytes -Follow recommendation by general surgery -Continue supportive care and follow clinical response.  2-dehydration, hyponatremia, hypokalemia -In the setting of GI losses and poor oral intake with acute diverticulitis process. -Continue fluid  resuscitation -Follow electrolytes trend and replete as needed.  3-Obesity, Class III, BMI 40-49.9 (morbid obesity) (HCC) -Low calorie diet, portion control and increase physical activity discussed with patient -Body mass index is 41.39 kg/m.  4-GERD (gastroesophageal reflux disease) -will use PPI  5-Leukocytosis -due to stress demargination and acute diverticulitis process -Continue current antibiotics and fluid resuscitation. -Repeat CBC in a.m. to follow WBCs trend.  DVT prophylaxis: Lovenox Code Status:   Full code Family Communication:  No family members at bedside. Disposition Plan:   Patient is from:  Home  Anticipated DC to:  Home  Anticipated DC date:  2 to 3 days if no surgical intervention needed.  Anticipated DC barriers: Ability to tolerate oral intake and having control of her abdominal pain. Consults called:  Dr. Lovell Sheehan (general surgery). Admission status:  Inpatient, length of stay more than 2 midnights, MedSurg bed.  Severity of Illness: Moderate to severe severity; patient with acute diverticulitis and contained perforation.  Active nausea/vomiting and abdominal pain with acute dehydration/electrolyte abnormalities.  Admitted for IV antibiotics, fluid resuscitation and electrolyte repletion.  General surgery has been consulted; currently without signs of needing acute surgical intervention.    Vassie Loll MD [facility]  How to contact the [facility] Attending or Consulting provider 7A - 7P or covering provider during after hours 7P -7A, for this patient?   1. Check the care team in [facility] and look for a) attending/consulting [facility] provider listed and b) the [facility] team listed 2. Log into [email or web address] and use [facility] universal password to access. If you do not have the password, please contact the hospital operator. 3. Locate the [facility] provider you are looking for under [facility] and page to a number that you can be directly reached. 4. If you still  have difficulty reaching the provider, please page the [facility] (Director on Call) for the Hospitalists listed on amion for assistance.  02/26/2020, 4:33 PM

## 2020-02-26 NOTE — ED Triage Notes (Signed)
Pt c/o constipation that started 3 days ago, n/v that Saturday am with lower abd pain that started yesterday am,

## 2020-02-26 NOTE — ED Provider Notes (Signed)
[facility] Provider Note   CSN: [id number] Arrival date & time: 02/26/20  [id number]     History Chief Complaint  Patient presents with  . Abdominal Pain    Peggy Rodriguez is a 34 y.o. female.  HPI   34 y/o female - hx of cholecystectomy in the past On Nexplanon - has had visit several weeks ago because of nausea vomiting and diarrhea, Peggy Rodriguez presents again today with some lower abdominal discomfort without any bowel movements in several days.  Peggy Rodriguez was nauseated, the pain started a couple of days ago, it is mostly lower abdomen and mostly left lower abdomen with some radiation into the left back.  This seems to get worse when Peggy Rodriguez pushes on her abdomen, Peggy Rodriguez has no urinary symptoms other than a feeling of pressure.  Peggy Rodriguez has not had any fevers though Peggy Rodriguez does have some subjective chills.  Peggy Rodriguez still has an appendix, has never had any ovarian symptoms, does not think that Peggy Rodriguez is pregnant at all.  Peggy Rodriguez tried some Zofran that Peggy Rodriguez had leftover from her prior ER visit which is helped, Peggy Rodriguez still has pain.  History reviewed. No pertinent past medical history.  There are no problems to display for this patient.   Past Surgical History:  Procedure Laterality Date  . CHOLECYSTECTOMY    . KNEE SURGERY       OB History   No obstetric history on file.     Family History  Problem Relation Age of Onset  . Hypertension Mother   . Coronary artery disease Mother   . Thyroid disease Mother     Social History   Tobacco Use  . Smoking status: Former Smoker    Packs/day: 0.50  . Smokeless tobacco: Never Used  Substance Use Topics  . Alcohol use: No  . Drug use: Not Currently    Home Medications Prior to Admission medications   Medication Sig Start Date End Date Taking? Authorizing Provider  etonogestrel (NEXPLANON) 68 MG IMPL implant 1 each by Subdermal route once.   Yes [provider]  ibuprofen (ADVIL) 800 MG tablet Take 800 mg by mouth every 6 (six) hours as  needed.   Yes [provider]  naproxen sodium (ALEVE) 220 MG tablet Take 220 mg by mouth daily as needed.   Yes [provider]  doxycycline (VIBRAMYCIN) 100 MG capsule Take 1 capsule (100 mg total) by mouth 2 (two) times daily. Patient not taking: Reported on 02/26/2020 09/20/19   Petrucelli, Pleas KochSamantha R, PA-C  fluconazole (DIFLUCAN) 150 MG tablet Take 1 tablet (150 mg total) by mouth daily. Take 1 tablet as needed for yeast infection symptoms Patient not taking: Reported on 02/26/2020 09/20/19   Petrucelli, Lelon MastSamantha R, PA-C  nystatin cream (MYCOSTATIN) APPLY PEA SIZE AMOUNT ON THE SKIN 2 3 TIMES PER DAY Patient not taking: Reported on 02/26/2020 08/11/19   [provider]  ondansetron (ZOFRAN) 4 MG tablet Take 1 tablet (4 mg total) by mouth every 6 (six) hours. As needed for nausea vomiting Patient not taking: Reported on 02/26/2020 01/31/20   Triplett, Tammy, PA-C  triamcinolone cream (KENALOG) 0.1 % APPLY TOPICALLY TO AFFECTED AREA TWICE A DAY FOR 2 WEEKS Patient not taking: Reported on 02/26/2020 08/11/19   [provider]    Allergies    Patient has no known allergies.  Review of Systems   Review of Systems  All other systems reviewed and are negative.   Physical Exam Updated Vital Signs BP 131/80  Pulse 86   Temp 99.5 F (37.5 C) (Oral)   Resp 20   Ht 1.778 m (5\' 10" )   Wt (!) 140 kg   SpO2 100%   BMI 44.29 kg/m   Physical Exam Vitals and nursing note reviewed.  Constitutional:      General: Peggy Rodriguez is not in acute distress.    Appearance: Peggy Rodriguez is well-developed.  HENT:     Head: Normocephalic and atraumatic.     Mouth/Throat:     Mouth: Mucous membranes are dry.     Pharynx: No oropharyngeal exudate.  Eyes:     General: No scleral icterus.       Right eye: No discharge.        Left eye: No discharge.     Conjunctiva/sclera: Conjunctivae normal.     Pupils: Pupils are equal, round, and reactive to light.  Neck:     Thyroid: No  thyromegaly.     Vascular: No JVD.  Cardiovascular:     Rate and Rhythm: Normal rate and regular rhythm.     Heart sounds: Normal heart sounds. No murmur heard.  No friction rub. No gallop.   Pulmonary:     Effort: Pulmonary effort is normal. No respiratory distress.     Breath sounds: Normal breath sounds. No wheezing or rales.  Abdominal:     General: Bowel sounds are normal. There is no distension.     Palpations: Abdomen is soft. There is no mass.     Tenderness: There is abdominal tenderness.     Comments: The patient has focal tenderness in the left lower quadrant as well as the midline lower abdomen.  There is no upper abdominal tenderness, Peggy Rodriguez is very soft diffusely, there is no peritoneal signs or guarding.  There is no CVA tenderness bilaterally, there is no tenderness at McBurney's point  Musculoskeletal:        General: No tenderness. Normal range of motion.     Cervical back: Normal range of motion and neck supple.  Lymphadenopathy:     Cervical: No cervical adenopathy.  Skin:    General: Skin is warm and dry.     Findings: No erythema or rash.  Neurological:     Mental Status: Peggy Rodriguez is alert.     Coordination: Coordination normal.  Psychiatric:        Behavior: Behavior normal.     ED Results / Procedures / Treatments   Labs (all labs ordered are listed, but only abnormal results are displayed) Labs Reviewed  URINALYSIS, ROUTINE W REFLEX MICROSCOPIC - Abnormal; Notable for the following components:      Result Value   Hgb urine dipstick SMALL (*)    Ketones, ur 20 (*)    All other components within normal limits  CBC WITH DIFFERENTIAL/PLATELET - Abnormal; Notable for the following components:   WBC 15.6 (*)    Hemoglobin 11.5 (*)    RDW 15.9 (*)    Neutro Abs 11.9 (*)    Monocytes Absolute 1.2 (*)    All other components within normal limits  COMPREHENSIVE METABOLIC PANEL - Abnormal; Notable for the following components:   Sodium 133 (*)    Potassium 3.2  (*)    CO2 21 (*)    Glucose, Bld 103 (*)    BUN 5 (*)    Calcium 8.8 (*)    AST 12 (*)    All other components within normal limits  PREGNANCY, URINE    EKG None  Radiology CT  ABDOMEN PELVIS W CONTRAST  Result Date: 02/26/2020 CLINICAL DATA:  Lower abdominal pain with swelling and constipation. Nausea and vomiting for 3 days. EXAM: CT ABDOMEN AND PELVIS WITH CONTRAST TECHNIQUE: Multidetector CT imaging of the abdomen and pelvis was performed using the standard protocol following bolus administration of intravenous contrast. CONTRAST:  OMNIPAQUE IOHEXOL 300 MG/ML  SOLN COMPARISON:  None. FINDINGS: Lower chest:  No contributory findings. Hepatobiliary: No focal liver abnormality.Cholecystectomy. Pancreas: Unremarkable. Spleen: Unremarkable. Adrenals/Urinary Tract: Negative adrenals. No hydronephrosis or stone. Unremarkable bladder. Stomach/Bowel: Left colonic diverticulosis with inflammation around a sigmoid diverticulum, complicated by extraluminal gas with a 17 mm span. No obstruction. No appendicitis. Vascular/Lymphatic: No acute vascular abnormality. No mass or adenopathy. Reproductive:No pathologic findings. Other: No ascites or pneumoperitoneum. Musculoskeletal: No acute abnormalities. Lower lumbar facet degeneration. IMPRESSION: Sigmoid diverticulitis complicated by contained perforation (local mesenteric gas without fluid collection). Electronically Signed   By: Marnee Spring M.D.   On: 02/26/2020 09:46    Procedures .Critical Care Performed by: Eber Hong, MD Authorized by: Eber Hong, MD   Critical care provider statement:    Critical care time (minutes):  35   Critical care time was exclusive of:  Separately billable procedures and treating other patients and teaching time   Critical care was necessary to treat or prevent imminent or life-threatening deterioration of the following conditions: Perforated large intestine.   Critical care was time spent personally by  me on the following activities:  Blood draw for specimens, development of treatment plan with patient or surrogate, discussions with consultants, evaluation of patient's response to treatment, examination of patient, obtaining history from patient or surrogate, ordering and performing treatments and interventions, ordering and review of laboratory studies, ordering and review of radiographic studies, pulse oximetry, re-evaluation of patient's condition and review of old charts   (including critical care time)  Medications Ordered in ED Medications  0.9 %  sodium chloride infusion ( Intravenous New Bag/Given 02/26/20 0812)  piperacillin-tazobactam (ZOSYN) IVPB 3.375 g (has no administration in time range)  ibuprofen (ADVIL) tablet 800 mg (800 mg Oral Given 02/26/20 0812)  ondansetron (ZOFRAN) injection 4 mg (4 mg Intravenous Given 02/26/20 0812)  iohexol (OMNIPAQUE) 300 MG/ML solution 100 mL (100 mLs Intravenous Contrast Given 02/26/20 4742)    ED Course  I have reviewed the triage vital signs and the nursing notes.  Pertinent labs & imaging results that were available during my care of the patient were reviewed by me and considered in my medical decision making (see chart for details).  Clinical Course as of Feb 25 954  Wynelle Link Feb 26, 2020  5956 UA with ketones suggestive of some dehydration - no glucose.  Pt does endorse smoking MJ, denies ETOH or tobacco.   [BM]  [id number] Laboratory work-up shows a leukocytosis of 15,600, renal function is preserved with normal creatinine, electrolytes are just off baseline with a sodium of 133 and a potassium of 3.2.   [BM]  0953 CT scan unfortunately shows that there has been a contained perforation of the sigmoid colon with diverticulitis.  General surgery Dr. Lovell Sheehan will be paged though this does not appear acutely surgical.  Antibiotics ordered and hospitalist will be consulted for admission   [BM]    Clinical Course User Index [BM] Eber Hong, MD    MDM Rules/Calculators/A&P                          This patient is not tachycardic on  my exam, Peggy Rodriguez does have a temperature of 99.5 and initial vital signs revealed a pulse of 110.  Given her dehydrated appearance, IV fluids will be initiated.  A CT scan will be ordered to rule out diverticulitis or complicating conditions.  Labs including a CBC, CMP and a urinalysis will also be ordered as well as a urine pregnancy.  The patient is agreeable to the plan agrees to ibuprofen but does not want anything stronger at this time.  IV antiemetics will be given in the form of ondansetron.  Rule out diverticulitis, aneurysm, ovarian cyst, kidney stone.  Patient agreeable to the plan  This patient has had a perforated viscus, antibiotics ordered  Discussed the care with Dr. Lovell Sheehan who will see the patient in consultation, agreeable that this is nonsurgical at this time  Discussed the case with Dr. Gwenlyn Perking of the hospitalist service who agrees with antibiotics, bowel rest and admitting the patient to the hospital.  Final Clinical Impression(s) / ED Diagnoses Final diagnoses:  Acute diverticulitis  Perforated viscus    Rx / DC Orders ED Discharge Orders    None       Eber Hong, MD 02/26/20 1008

## 2020-02-27 DIAGNOSIS — L732 Hidradenitis suppurativa: Secondary | ICD-10-CM

## 2020-02-27 DIAGNOSIS — K5792 Diverticulitis of intestine, part unspecified, without perforation or abscess without bleeding: Secondary | ICD-10-CM

## 2020-02-27 DIAGNOSIS — K5720 Diverticulitis of large intestine with perforation and abscess without bleeding: Secondary | ICD-10-CM | POA: Diagnosis not present

## 2020-02-27 LAB — BASIC METABOLIC PANEL
Anion gap: 9 (ref 5–15)
BUN: 5 mg/dL — ABNORMAL LOW (ref 6–20)
CO2: 23 mmol/L (ref 22–32)
Calcium: 8.3 mg/dL — ABNORMAL LOW (ref 8.9–10.3)
Chloride: 102 mmol/L (ref 98–111)
Creatinine, Ser: 0.67 mg/dL (ref 0.44–1.00)
GFR calc Af Amer: >60 mL/min (ref >60)
GFR calc non Af Amer: >60 mL/min (ref >60)
Glucose, Bld: 97 mg/dL (ref 70–99)
Potassium: 3.1 mmol/L — ABNORMAL LOW (ref 3.5–5.1)
Sodium: 134 mmol/L — ABNORMAL LOW (ref 135–145)

## 2020-02-27 LAB — CBC
HCT: 35.5 % — ABNORMAL LOW (ref 36.0–46.0)
Hemoglobin: 11.1 g/dL — ABNORMAL LOW (ref 12.0–15.0)
MCH: 27.1 pg (ref 26.0–34.0)
MCHC: 31.3 g/dL (ref 30.0–36.0)
MCV: 86.8 fL (ref 80.0–100.0)
Platelets: 303 10*3/uL (ref 150–400)
RBC: 4.09 MIL/uL (ref 3.87–5.11)
RDW: 16 % — ABNORMAL HIGH (ref 11.5–15.5)
WBC: 16.1 10*3/uL — ABNORMAL HIGH (ref 4.0–10.5)
nRBC: 0 % (ref 0.0–0.2)

## 2020-02-27 LAB — LACTIC ACID, PLASMA: Lactic Acid, Venous: 0.7 mmol/L (ref 0.5–1.9)

## 2020-02-27 LAB — HIV ANTIBODY (ROUTINE TESTING W REFLEX): HIV Screen 4th Generation wRfx: NONREACTIVE

## 2020-02-27 MED ORDER — ACETAMINOPHEN 325 MG PO TABS
650.0000 mg | ORAL_TABLET | Freq: Four times a day (QID) | ORAL | Status: DC | PRN
  Administered 2020-02-27: 650 mg via ORAL
  Filled 2020-02-27: qty 2

## 2020-02-27 MED ORDER — OXYCODONE HCL 5 MG PO TABS
5.0000 mg | ORAL_TABLET | ORAL | Status: DC | PRN
  Administered 2020-02-28 (×3): 5 mg via ORAL
  Administered 2020-02-29 (×2): 10 mg via ORAL
  Filled 2020-02-27 (×2): qty 2
  Filled 2020-02-27: qty 1
  Filled 2020-02-27: qty 2
  Filled 2020-02-27: qty 1

## 2020-02-27 NOTE — Consult Note (Addendum)
I was present with the medical student for this service. I personally verified the history of present illness, performed the physical exam, and made the plan for this encounter. I have verified the medical student's documentation and made modifications where appropriately. I have personally documented in my own words a brief history, physical, and plan below.     [name] is a 34 yo with GERD, hidradenitis who presented with 3 days LLQ pain and nausea /vomiting. She reports mostly lower abdominal pain and pressure. She has not had a BM but is passing gas. She has not had any nausea today.   Grandfather with colon cancer in his 3s. No family history of IBD. No personal history of prior diverticulitis or rectal bleeding.   Soft, nondistended, tender lower abdomen, no guarding  Personally reviewed CT- diverticula throughout colon and haziness in mesentery with gas but no fluid collection   We have discussed that diverticulitis is a spectrum of disease. We discussed that it ranges from simple diverticulitis that can be treated with oral antibiotics as an outpatient to severe cases with perforations that require emergency surgery and colostomy.  We discussed that her case is more complicated due to the perforation.  We have discussed that some of the complicated cases require admission to the hospital for IV antibiotics and otherwise still require Interventional Radiology drainage of an abscess. We have discussed that rarely some people need a colostomy if their symptoms worsen.  We discussed plans for outpatient discussion and future colonoscopy.  Clear diet Roxicodone added for oral pain option  Zosyn  Hopefully ~3 days of IV antibiotics and home to complete 10  Days Augmentin   Algis Greenhouse, MD [facility] [address] [place], [place] [address] [phone] (office)     Reason for Consult: Diverticulitis with contained perforation Referring Physician:  Vassie Loll, MD  Peggy Rodriguez is a 34 y.o. African American female with 3 day history of LLQ abdominal pain, constipation, n/v, and fever.  HPI: Peggy Rodriguez reports that on Friday (8/20) she began feeling more fatigued than usual. The next morning she began having lower abdominal pressure and pain with nausea and vomiting of yellow emesis. She describes the pain at the time as crampy and most severe in her LLQ. Additionally, she noticed some swelling and warmth of her lower abdomen that has remained consistent since then. She has little to no appetite with little PO intake since Friday due to her nausea and pain. She had not had a bowel movement since Friday until early this morning when she passed 3-4 small, hard stool pellets and flatus. Since admission she reports having a fever starting last night which got as high as 102 at 1 AM, but this resolved after being given tylenol.  On exam, she reports 5/10 diffuse crampy pain at rest and 10/10 sharp pain to palpation that is most severe in the LLQ. Rebound tenderness present. She was given 2 mg Morphine IV approximately 45 mins prior to exam.  She denies hematochezia, melena, hematemesis.  PMHx significant for Morbid Obesity, Hydradenitis Suppurativa, and GERD  Past Surgical History:  Procedure Laterality Date  . CHOLECYSTECTOMY    . KNEE SURGERY      Family History  Problem Relation Age of Onset  . Hypertension Mother   . Coronary artery disease Mother   . Thyroid disease Mother     Social History:  reports that she has quit smoking. She smoked 0.50 packs per day. She has  never used smokeless tobacco. She reports previous drug use. She reports that she does not drink alcohol.  Allergies: No Known Allergies  Medications:  Nexplanon 68 mg implant Ibuprofen 800 mg PO q6h PRN Aleve 220 mg PO QD PRN    Results for orders placed or performed during the hospital encounter of 02/26/20 (from the past 48 hour(s))  Urinalysis, Routine w  reflex microscopic Urine, Clean Catch     Status: Abnormal   Collection Time: 02/26/20  7:54 AM  Result Value Ref Range   Color, Urine YELLOW YELLOW   APPearance CLEAR CLEAR   Specific Gravity, Urine 1.009 1.005 - 1.030   pH 7.0 5.0 - 8.0   Glucose, UA NEGATIVE NEGATIVE mg/dL   Hgb urine dipstick SMALL (A) NEGATIVE   Bilirubin Urine NEGATIVE NEGATIVE   Ketones, ur 20 (A) NEGATIVE mg/dL   Protein, ur NEGATIVE NEGATIVE mg/dL   Nitrite NEGATIVE NEGATIVE   Leukocytes,Ua NEGATIVE NEGATIVE   RBC / HPF 0-5 0 - 5 RBC/hpf   WBC, UA 0-5 0 - 5 WBC/hpf   Bacteria, UA NONE SEEN NONE SEEN   Squamous Epithelial / LPF 0-5 0 - 5    Comment: Performed at [facility], [address], [place], [place] [address]  Pregnancy, urine     Status: None   Collection Time: 02/26/20  7:54 AM  Result Value Ref Range   Preg Test, Ur NEGATIVE NEGATIVE    Comment:        THE SENSITIVITY OF THIS METHODOLOGY IS >20 mIU/mL. Performed at [facility], [address], [place], [place] [address]   CBC with Differential/Platelet     Status: Abnormal   Collection Time: 02/26/20  8:39 AM  Result Value Ref Range   WBC 15.6 (H) 4.0 - 10.5 K/uL   RBC 4.25 3.87 - 5.11 MIL/uL   Hemoglobin 11.5 (L) 12.0 - 15.0 g/dL   HCT 86.5 36 - 46 %   MCV 86.8 80.0 - 100.0 fL   MCH 27.1 26.0 - 34.0 pg   MCHC 31.2 30.0 - 36.0 g/dL   RDW 78.4 (H) 69.6 - 29.5 %   Platelets 309 150 - 400 K/uL   nRBC 0.0 0.0 - 0.2 %   Neutrophils Relative % 77 %   Neutro Abs 11.9 (H) 1.7 - 7.7 K/uL   Lymphocytes Relative 15 %   Lymphs Abs 2.4 0.7 - 4.0 K/uL   Monocytes Relative 8 %   Monocytes Absolute 1.2 (H) 0 - 1 K/uL   Eosinophils Relative 0 %   Eosinophils Absolute 0.0 0 - 0 K/uL   Basophils Relative 0 %   Basophils Absolute 0.0 0 - 0 K/uL   Immature Granulocytes 0 %   Abs Immature Granulocytes 0.05 0.00 - 0.07 K/uL    Comment: Performed at [facility], [address], [place], [place] [address]  Comprehensive metabolic panel     Status:  Abnormal   Collection Time: 02/26/20  8:39 AM  Result Value Ref Range   Sodium 133 (L) 135 - 145 mmol/L   Potassium 3.2 (L) 3.5 - 5.1 mmol/L   Chloride 102 98 - 111 mmol/L   CO2 21 (L) 22 - 32 mmol/L   Glucose, Bld 103 (H) 70 - 99 mg/dL    Comment: Glucose reference range applies only to samples taken after fasting for at least 8 hours.   BUN 5 (L) 6 - 20 mg/dL   Creatinine, Ser 2.44 0.44 - 1.00 mg/dL   Calcium 8.8 (  L) 8.9 - 10.3 mg/dL   Total Protein 7.3 6.5 - 8.1 g/dL   Albumin 3.7 3.5 - 5.0 g/dL   AST 12 (L) 15 - 41 U/L   ALT 15 0 - 44 U/L   Alkaline Phosphatase 64 38 - 126 U/L   Total Bilirubin 1.1 0.3 - 1.2 mg/dL   GFR calc non Af Amer >60 >60 mL/min   GFR calc Af Amer >60 >60 mL/min   Anion gap 10 5 - 15    Comment: Performed at [facility], [address] [place], [place] [address]  HIV Antibody (routine testing w rflx)     Status: None   Collection Time: 02/26/20  8:40 AM  Result Value Ref Range   HIV Screen 4th Generation wRfx Non Reactive Non Reactive    Comment: Performed at [facility], [address] [place], [place] [address]  SARS Coronavirus 2 by RT PCR (hospital order, performed in [facility] hospital lab) Nasopharyngeal Nasopharyngeal Swab     Status: None   Collection Time: 02/26/20 10:07 AM   Specimen: Nasopharyngeal Swab  Result Value Ref Range   SARS Coronavirus 2 NEGATIVE NEGATIVE    Comment: (NOTE) SARS-CoV-2 target nucleic acids are NOT DETECTED.  The SARS-CoV-2 RNA is generally detectable in upper and lower respiratory specimens during the acute phase of infection. The lowest concentration of SARS-CoV-2 viral copies this assay can detect is 250 copies / mL. A negative result does not preclude SARS-CoV-2 infection and should not be used as the sole basis for treatment or other patient management decisions.  A negative result may occur with improper specimen collection / handling, submission of specimen other than nasopharyngeal swab,  presence of viral mutation(s) within the areas targeted by this assay, and inadequate number of viral copies (<250 copies / mL). A negative result must be combined with clinical observations, patient history, and epidemiological information.  Fact Sheet for Patients:   BoilerBrush.com.cy  Fact Sheet for Healthcare Providers: https://pope.com/  This test is not yet approved or  cleared by the [place] FDA and has been authorized for detection and/or diagnosis of SARS-CoV-2 by FDA under an Emergency Use Authorization (EUA).  This EUA will remain in effect (meaning this test can be used) for the duration of the COVID-19 declaration under Section 564(b)(1) of the Act, 21 U.S.C. section 360bbb-3(b)(1), unless the authorization is terminated or revoked sooner.  Performed at [facility], [address] [place], [place] [address]   CBC     Status: Abnormal   Collection Time: 02/27/20  1:44 AM  Result Value Ref Range   WBC 16.1 (H) 4.0 - 10.5 K/uL   RBC 4.09 3.87 - 5.11 MIL/uL   Hemoglobin 11.1 (L) 12.0 - 15.0 g/dL   HCT 58.5 (L) 36 - 46 %   MCV 86.8 80.0 - 100.0 fL   MCH 27.1 26.0 - 34.0 pg   MCHC 31.3 30.0 - 36.0 g/dL   RDW 27.7 (H) 82.4 - 23.5 %   Platelets 303 150 - 400 K/uL   nRBC 0.0 0.0 - 0.2 %    Comment: Performed at [facility], [address] [place], [place] [address]  Basic metabolic panel     Status: Abnormal   Collection Time: 02/27/20  1:44 AM  Result Value Ref Range   Sodium 134 (L) 135 - 145 mmol/L   Potassium 3.1 (L) 3.5 - 5.1 mmol/L   Chloride 102 98 - 111 mmol/L   CO2 23 22 -  32 mmol/L   Glucose, Bld 97 70 - 99 mg/dL    Comment: Glucose reference range applies only to samples taken after fasting for at least 8 hours.   BUN 5 (L) 6 - 20 mg/dL   Creatinine, Ser 9.620.67 0.44 - 1.00 mg/dL   Calcium 8.3 (L) 8.9 - 10.3 mg/dL   GFR calc non Af Amer >60 >60 mL/min   GFR calc Af Amer >60 >60 mL/min   Anion gap 9 5  - 15    Comment: Performed at [facility], [address] [place], [place] [address]  Culture, blood (routine x 2)     Status: None (Preliminary result)   Collection Time: 02/27/20  1:44 AM   Specimen: Left Antecubital; Blood  Result Value Ref Range   Specimen Description LEFT ANTECUBITAL    Special Requests      BOTTLES DRAWN AEROBIC AND ANAEROBIC Blood Culture adequate volume Performed at [facility], [address] [place], [place] [address]    Culture PENDING    Report Status PENDING   Culture, blood (routine x 2)     Status: None (Preliminary result)   Collection Time: 02/27/20  1:55 AM   Specimen: BLOOD LEFT HAND  Result Value Ref Range   Specimen Description BLOOD LEFT HAND    Special Requests      BOTTLES DRAWN AEROBIC AND ANAEROBIC Blood Culture adequate volume Performed at [facility], [address] [place], [place] [address]    Culture PENDING    Report Status PENDING   Lactic acid, plasma     Status: None   Collection Time: 02/27/20  8:47 AM  Result Value Ref Range   Lactic Acid, Venous 0.7 0.5 - 1.9 mmol/L    Comment: Performed at [facility], [address] [place], [place] [address]    CT ABDOMEN PELVIS W CONTRAST  Result Date: 02/26/2020 CLINICAL DATA:  Lower abdominal pain with swelling and constipation. Nausea and vomiting for 3 days. EXAM: CT ABDOMEN AND PELVIS WITH CONTRAST TECHNIQUE: Multidetector CT imaging of the abdomen and pelvis was performed using the standard protocol following bolus administration of intravenous contrast. CONTRAST:  100mL OMNIPAQUE IOHEXOL 300 MG/ML  SOLN COMPARISON:  None. FINDINGS: Lower chest:  No contributory findings. Hepatobiliary: No focal liver abnormality.Cholecystectomy. Pancreas: Unremarkable. Spleen: Unremarkable. Adrenals/Urinary Tract: Negative adrenals. No hydronephrosis or stone. Unremarkable bladder. Stomach/Bowel: Left colonic diverticulosis with inflammation around a sigmoid diverticulum, complicated by  extraluminal gas with a 17 mm span. No obstruction. No appendicitis. Vascular/Lymphatic: No acute vascular abnormality. No mass or adenopathy. Reproductive:No pathologic findings. Other: No ascites or pneumoperitoneum. Musculoskeletal: No acute abnormalities. Lower lumbar facet degeneration. IMPRESSION: Sigmoid diverticulitis complicated by contained perforation (local mesenteric gas without fluid collection). Electronically Signed   By: Marnee SpringJonathon  Watts M.D.   On: 02/26/2020 09:46    ROS:  Pertinent items are noted in HPI.  Blood pressure 109/63, pulse 88, temperature 99.5 F (37.5 C), temperature source Oral, resp. rate 18, height 5\' 11"  (1.803 m), weight 134.6 kg, SpO2 100 %. Physical Exam Constitutional: no acute distress, resting comfortably in bed Head: atraumatic, normocephalic Cardiovascular: regular rate and rhythm, no M/R/G Pulmonary: effort normal, normal breath sounds bilaterally Abdominal: mildly distended; TTP in all quadrants, most severe LLQ; rebound tenderness Skin: warm and dry Neurological: alert, no focal deficit Psychiatric: normal mood and affect   Assessment/Plan:  Arvilla MeresJessica S. Depriest is a 34 year old woman with a past medical history significant for morbid obesity who presented with 3 days  of worsening lower abdominal pain and swelling, n/v, and fever who was admitted for diverticulitis with contained perforation.  Diverticulitis with Contained Perforation Worsening LLQ abdominal pain, rebound tenderness, anorexia, n/v, fever, and leukocytosis to 15.6 which has increased slightly to 16.1 today. She denies any recent frank hematochezia or melena; however, hemoglobin is 11.1 today from 12.4 on 7/27. CT abdomen with contrast confirmed sigmoid diverticulitis with perforation contained within the mesentery. She has received IV NS at 250 mL/hr, Zosyn 3.375 g q8hr started yesterday. Pain moderately well controlled with 2 mg IV morphine every 3 hours. Pt is hemodynamically stable  on appropriate antibiotic therapy. Will continue to watch for improvement in pain and stable vital signs. Consider surgical intervention if worsening uncontrolled pain or hemodynamic instability develop. -NPO for bowel rest -D5, 1/2 NS 75 cc/hr -cont IV Zosyn 3.375 g q8h, Day 2 -cont IV Morphine 2 mg q3h PRN -cont Tylenol 650 mg q6h PRN for fever   Nonah Mattes 02/27/2020, 12:28 PM

## 2020-02-27 NOTE — Progress Notes (Signed)
PROGRESS NOTE    Peggy Rodriguez  WTU:[id number] DOB: Jul 08, 1985 DOA: 02/26/2020 PCP: Health, [facility] (   Chief Complaint  Patient presents with  . Abdominal Pain    Brief Narrative: Peggy Rodriguez is a 34 y.o. female with medical history significant of with past medical history significant for prior history of cholecystectomy, knee osteoarthritis and morbid obesity; who presented to the hospital secondary to abdominal pain, nausea and vomiting.  Patient reports symptoms started approximately 3-4 days and worsening.  She expressed pain in the lower aspect of her abdomen, 7-8 out of 10 in intensity, crampy in nature, associated with poor appetite, nausea, dry heaving/vomiting and constipation.  Patient reports pain is radiated to the lower aspect of her left back. Patient denies dysuria, hematuria, melena, hematochezia, hematemesis, fever, chills, headaches, focal neurologic deficits or any other complaints.  No sick contacts.  Covid test check in the ED and negative.  ED Course: CT abdomen and pelvis demonstrated sigmoid diverticulitis with contained perforation, mild hyponatremia/hypokalemia and elevated WBCs appreciated on the blood work.  Patient received IV fluids, IV antibiotics [facility] has been contacted for further evaluation and management.  General surgery service was also called at time of admission.   Assessment & Plan: 1-sigmoid diverticulitis with contained perforation -Still having abdominal pain, spiking fever and with elevated WBCs. -Continue bowel rest -Continue IV antibiotics-continue as needed antiemetics and analgesics. -Follow electrolytes and further replete as needed -Continue IV fluids. -Follow general surgery recommendation.  2-dehydration, hyponatremia, hypokalemia -Continue IV fluids as mentioned above -Continue replete electrolytes and follow trend.  3-obesity, class III -Body mass index is 41.39 kg/m. -Low calorie diet, portion control  and increase physical activity discussed with patient.  4-gastroesophageal reflux disease -Continue PPI.  5-leukocytosis -in the setting of stress demargination and #1 -continue IVF's -continue IV antibiotics -follow WBC's trend   DVT prophylaxis: Heparin Code Status: Full code Family Communication: Mom over the phone. Disposition:   Status is: Inpatient  Dispo: The patient is from: home              Anticipated d/c is to: home              Anticipated d/c date is:1-2 days if continue to improved and not surgical intervention needed.              Patient currently no medically stable for discharge, still complaining of left lower quadrant pain, spiking fever and with WBCs going the wrong direction.  For now continue conservative management and IV antibiotics.  Will follow general surgery recommendation for repeat images and diet advanced purposes.       Consultants:   General surgery   Procedures:  See below for x-ray reports   Antimicrobials:  Zosyn 02/26/20   Subjective: T-max 101.6; still complaining of pain in her left lower quadrant.  Reports no nausea and no vomiting.  Objective: Vitals:   02/27/20 0104 02/27/20 0222 02/27/20 0447 02/27/20 1400  BP: 128/74  109/63 124/75  Pulse: (!) 110  88 90  Resp: 18  18 20   Temp: (!) 101.6 F (38.7 C) 100.2 F (37.9 C) 99.5 F (37.5 C) 98.4 F (36.9 C)  TempSrc: Oral Oral Oral Oral  SpO2: 100%  100%   Weight:      Height:        Intake/Output Summary (Last 24 hours) at 02/27/2020 1751 Last data filed at 02/27/2020 0300 Gross per 24 hour  Intake 311.99 ml  Output --  Net 311.99 ml   Filed Weights   02/26/20 0741 02/26/20 1529  Weight: (!) 140 kg 134.6 kg    Examination:  General exam: Appears calm and in no acute distress; patient is spiked fever with a T-max of 101.6.  Still having left lower quadrant pain with noticeable increased in her WBCs.  No further nausea or vomiting.  Reports feeling  better. Respiratory system: Clear to auscultation. Respiratory effort normal. Cardiovascular system: S1 & S2 heard, RRR. No JVD, murmurs, rubs, gallops or clicks. No pedal edema. Gastrointestinal system: Abdomen is obese, nondistended, with positive left lower quadrant pain and mild rebound; no guarding.  Positive bowel sounds.   Central nervous system: Alert and oriented. No focal neurological deficits. Extremities: No cyanosis or clubbing. Skin: No rash, no petechiae. Psychiatry: Judgement and insight appear normal. Mood & affect appropriate.     Data Reviewed: I have personally reviewed following labs and imaging studies  CBC: Recent Labs  Lab 02/26/20 0839 02/27/20 0144  WBC 15.6* 16.1*  NEUTROABS 11.9*  --   HGB 11.5* 11.1*  HCT 36.9 35.5*  MCV 86.8 86.8  PLT 309 303    Basic Metabolic Panel: Recent Labs  Lab 02/26/20 0839 02/27/20 0144  NA 133* 134*  K 3.2* 3.1*  CL 102 102  CO2 21* 23  GLUCOSE 103* 97  BUN 5* 5*  CREATININE 0.59 0.67  CALCIUM 8.8* 8.3*    GFR: Estimated Creatinine Clearance: 152.1 mL/min (by C-G formula based on SCr of 0.67 mg/dL).  Liver Function Tests: Recent Labs  Lab 02/26/20 0839  AST 12*  ALT 15  ALKPHOS 64  BILITOT 1.1  PROT 7.3  ALBUMIN 3.7    CBG: No results for input(s): GLUCAP in the last 168 hours.   Recent Results (from the past 240 hour(s))  SARS Coronavirus 2 by RT PCR (hospital order, performed in [facility] hospital lab) Nasopharyngeal Nasopharyngeal Swab     Status: None   Collection Time: 02/26/20 10:07 AM   Specimen: Nasopharyngeal Swab  Result Value Ref Range Status   SARS Coronavirus 2 NEGATIVE NEGATIVE Final    Comment: (NOTE) SARS-CoV-2 target nucleic acids are NOT DETECTED.  The SARS-CoV-2 RNA is generally detectable in upper and lower respiratory specimens during the acute phase of infection. The lowest concentration of SARS-CoV-2 viral copies this assay can detect is 250 copies / mL. A negative  result does not preclude SARS-CoV-2 infection and should not be used as the sole basis for treatment or other patient management decisions.  A negative result may occur with improper specimen collection / handling, submission of specimen other than nasopharyngeal swab, presence of viral mutation(s) within the areas targeted by this assay, and inadequate number of viral copies (<250 copies / mL). A negative result must be combined with clinical observations, patient history, and epidemiological information.  Fact Sheet for Patients:   BoilerBrush.com.cy  Fact Sheet for Healthcare Providers: https://pope.com/  This test is not yet approved or  cleared by the [place] FDA and has been authorized for detection and/or diagnosis of SARS-CoV-2 by FDA under an Emergency Use Authorization (EUA).  This EUA will remain in effect (meaning this test can be used) for the duration of the COVID-19 declaration under Section 564(b)(1) of the Act, 21 U.S.C. section 360bbb-3(b)(1), unless the authorization is terminated or revoked sooner.  Performed at [facility], [address], [place], [place] [address]   Culture, blood (routine x 2)     Status: None (Preliminary result)  Collection Time: 02/27/20  1:44 AM   Specimen: Left Antecubital; Blood  Result Value Ref Range Status   Specimen Description LEFT ANTECUBITAL  Final   Special Requests   Final    BOTTLES DRAWN AEROBIC AND ANAEROBIC Blood Culture adequate volume Performed at [facility], [address], [place], [place] [address]    Culture PENDING  Incomplete   Report Status PENDING  Incomplete  Culture, blood (routine x 2)     Status: None (Preliminary result)   Collection Time: 02/27/20  1:55 AM   Specimen: BLOOD LEFT HAND  Result Value Ref Range Status   Specimen Description BLOOD LEFT HAND  Final   Special Requests   Final    BOTTLES DRAWN AEROBIC AND ANAEROBIC Blood Culture adequate  volume Performed at [facility], [address] [place], [place] [address]    Culture PENDING  Incomplete   Report Status PENDING  Incomplete      Radiology Studies: CT ABDOMEN PELVIS W CONTRAST  Result Date: 02/26/2020 CLINICAL DATA:  Lower abdominal pain with swelling and constipation. Nausea and vomiting for 3 days. EXAM: CT ABDOMEN AND PELVIS WITH CONTRAST TECHNIQUE: Multidetector CT imaging of the abdomen and pelvis was performed using the standard protocol following bolus administration of intravenous contrast. CONTRAST:  OMNIPAQUE IOHEXOL 300 MG/ML  SOLN COMPARISON:  None. FINDINGS: Lower chest:  No contributory findings. Hepatobiliary: No focal liver abnormality.Cholecystectomy. Pancreas: Unremarkable. Spleen: Unremarkable. Adrenals/Urinary Tract: Negative adrenals. No hydronephrosis or stone. Unremarkable bladder. Stomach/Bowel: Left colonic diverticulosis with inflammation around a sigmoid diverticulum, complicated by extraluminal gas with a 17 mm span. No obstruction. No appendicitis. Vascular/Lymphatic: No acute vascular abnormality. No mass or adenopathy. Reproductive:No pathologic findings. Other: No ascites or pneumoperitoneum. Musculoskeletal: No acute abnormalities. Lower lumbar facet degeneration. IMPRESSION: Sigmoid diverticulitis complicated by contained perforation (local mesenteric gas without fluid collection). Electronically Signed   By: Marnee Spring M.D.   On: 02/26/2020 09:46     Scheduled Meds: . heparin injection (subcutaneous)  5,000 Units Subcutaneous Q8H  . pantoprazole (PROTONIX) IV  40 mg Intravenous Q24H   Continuous Infusions: . dextrose 5 % and 0.45 % NaCl with KCl 40 mEq/L 75 mL/hr at 02/27/20 1245  . piperacillin-tazobactam 3.375 g (02/27/20 1408)     LOS: 1 day    Time spent: 35 minutes.    Vassie Loll, MD [facility]   To contact the attending provider between 7A-7P or the covering provider during after hours 7P-7A, please  log into the web site [email or web address] and access using universal [place] password for that web site. If you do not have the password, please call the hospital operator.  02/27/2020, 5:51 PM

## 2020-02-28 DIAGNOSIS — E876 Hypokalemia: Secondary | ICD-10-CM

## 2020-02-28 LAB — MAGNESIUM: Magnesium: 2.3 mg/dL (ref 1.7–2.4)

## 2020-02-28 LAB — CBC WITH DIFFERENTIAL/PLATELET
Abs Immature Granulocytes: 0.02 10*3/uL (ref 0.00–0.07)
Basophils Absolute: 0 10*3/uL (ref 0.0–0.1)
Basophils Relative: 0 %
Eosinophils Absolute: 0.3 10*3/uL (ref 0.0–0.5)
Eosinophils Relative: 4 %
HCT: 37.3 % (ref 36.0–46.0)
Hemoglobin: 11.4 g/dL — ABNORMAL LOW (ref 12.0–15.0)
Immature Granulocytes: 0 %
Lymphocytes Relative: 29 %
Lymphs Abs: 2.5 10*3/uL (ref 0.7–4.0)
MCH: 27 pg (ref 26.0–34.0)
MCHC: 30.6 g/dL (ref 30.0–36.0)
MCV: 88.2 fL (ref 80.0–100.0)
Monocytes Absolute: 0.7 10*3/uL (ref 0.1–1.0)
Monocytes Relative: 9 %
Neutro Abs: 5 10*3/uL (ref 1.7–7.7)
Neutrophils Relative %: 58 %
Platelets: 344 10*3/uL (ref 150–400)
RBC: 4.23 MIL/uL (ref 3.87–5.11)
RDW: 15.8 % — ABNORMAL HIGH (ref 11.5–15.5)
WBC: 8.5 10*3/uL (ref 4.0–10.5)
nRBC: 0 % (ref 0.0–0.2)

## 2020-02-28 LAB — BASIC METABOLIC PANEL
Anion gap: 10 (ref 5–15)
BUN: <5 mg/dL — ABNORMAL LOW (ref 6–20)
CO2: 26 mmol/L (ref 22–32)
Calcium: 8.6 mg/dL — ABNORMAL LOW (ref 8.9–10.3)
Chloride: 98 mmol/L (ref 98–111)
Creatinine, Ser: 0.63 mg/dL (ref 0.44–1.00)
GFR calc Af Amer: >60 mL/min (ref >60)
GFR calc non Af Amer: >60 mL/min (ref >60)
Glucose, Bld: 95 mg/dL (ref 70–99)
Potassium: 3.5 mmol/L (ref 3.5–5.1)
Sodium: 134 mmol/L — ABNORMAL LOW (ref 135–145)

## 2020-02-28 MED ORDER — DOCUSATE SODIUM 100 MG PO CAPS
100.0000 mg | ORAL_CAPSULE | Freq: Two times a day (BID) | ORAL | Status: DC
  Administered 2020-02-28: 100 mg via ORAL
  Filled 2020-02-28 (×3): qty 1

## 2020-02-28 NOTE — Progress Notes (Signed)
Patient diet will be further advance in the morning and if tolerated for breakfast/lunch will be able to be discharged home on oral antibiotics.

## 2020-02-28 NOTE — Progress Notes (Signed)
PROGRESS NOTE    Peggy Rodriguez  PXT:[id number] DOB: Mar 26, 1986 DOA: 02/26/2020 PCP: Health, [facility]    Chief Complaint  Patient presents with  . Abdominal Pain    Brief Narrative: Peggy Rodriguez is a 34 y.o. female with medical history significant of with past medical history significant for prior history of cholecystectomy, knee osteoarthritis and morbid obesity; who presented to the hospital secondary to abdominal pain, nausea and vomiting.  Patient reports symptoms started approximately 3-4 days and worsening.  She expressed pain in the lower aspect of her abdomen, 7-8 out of 10 in intensity, crampy in nature, associated with poor appetite, nausea, dry heaving/vomiting and constipation.  Patient reports pain is radiated to the lower aspect of her left back. Patient denies dysuria, hematuria, melena, hematochezia, hematemesis, fever, chills, headaches, focal neurologic deficits or any other complaints.  No sick contacts.  Covid test check in the ED and negative.  ED Course: CT abdomen and pelvis demonstrated sigmoid diverticulitis with contained perforation, mild hyponatremia/hypokalemia and elevated WBCs appreciated on the blood work.  Patient received IV fluids, IV antibiotics [facility] has been contacted for further evaluation and management.  General surgery service was also called at time of admission.   Assessment & Plan: 1-sigmoid diverticulitis with contained perforation -Still having abdominal pain, but better; no further fever, normal WBC's today. -slowly advancing diet. -Continue current IV antibiotics and continue as needed antiemetics and analgesics. -Continue to follow electrolytes and further replete as needed. -Continue IV fluids, will adjust rate based on diet management.. -continue to Follow general surgery recommendations, no surgical intervention needed currently..  2-dehydration, hyponatremia, hypokalemia -Continue IV fluids as mentioned  above -Continue repleting electrolytes and follow trend. -IV fluid rate will be adjusted as diet is advanced.  3-obesity, class III -Body mass index is 41.39 kg/m. -Low calorie diet, portion control and increase physical activity discussed with patient.  4-gastroesophageal reflux disease -Continue PPI.  5-leukocytosis -in the setting of stress demargination and #1 -White blood cells down and within normal limits currently -Will continue IV fluids and current antibiotics.   DVT prophylaxis: Heparin Code Status: Full code Family Communication: Mom over the phone. Disposition:   Status is: Inpatient  Dispo: The patient is from: home              Anticipated d/c is to: home              Anticipated d/c date is:1-2 days if continue to improved and not surgical intervention needed.              Patient currently no medically stable for discharge, still complaining of left lower quadrant pain, spiking fever and with WBCs going the wrong direction.  For now continue conservative management and IV antibiotics.  Will follow general surgery recommendation for repeat images and diet advanced purposes.       Consultants:   General surgery   Procedures:  See below for x-ray reports   Antimicrobials:  Zosyn 02/26/20   Subjective: No fever, no chest pain, no shortness of breath, no nausea vomiting.  Still having some left lower quadrant abdominal discomfort, but patient expressed is improving and getting better.  Objective: Vitals:   02/27/20 0447 02/27/20 1400 02/27/20 2149 02/28/20 0454  BP: 109/63 124/75 119/83 109/66  Pulse: 88 90 89 78  Resp: 18 20 (!) 24 18  Temp: 99.5 F (37.5 C) 98.4 F (36.9 C) 99.9 F (37.7 C) 98.8 F (37.1 C)  TempSrc: Oral Oral  Oral Oral  SpO2: 100%  99% 100%  Weight:      Height:       No intake or output data in the 24 hours ending 02/28/20 1322 Filed Weights   02/26/20 0741 02/26/20 1529  Weight: (!) 140 kg 134.6 kg     Examination: General exam: Alert, awake, oriented x 3, feeling better today.  No further episode of fevers and expressing improvement in her left lower quadrant pain.  No nausea, no vomiting. Has so far tolerated clear liquid diet advanced last night. Respiratory system: Clear to auscultation. Respiratory effort normal.  Good oxygen saturation on room air. Cardiovascular system:RRR. No murmurs, rubs, gallops.  No JVD (even difficult to assess secondary to body habitus). Gastrointestinal system: Abdomen is nondistended, soft and without guarding; left lower quadrant tenderness to palpation appreciated on exam.  Positive bowel sounds. Central nervous system: Alert and oriented. No focal neurological deficits. Extremities: No C/C/E, +pedal pulses Skin: No rashes, lesions or ulcers Psychiatry: Judgement and insight appear normal. Mood & affect appropriate.    Data Reviewed: I have personally reviewed following labs and imaging studies  CBC: Recent Labs  Lab 02/26/20 0839 02/27/20 0144 02/28/20 0657  WBC 15.6* 16.1* 8.5  NEUTROABS 11.9*  --  5.0  HGB 11.5* 11.1* 11.4*  HCT 36.9 35.5* 37.3  MCV 86.8 86.8 88.2  PLT 309 303 344    Basic Metabolic Panel: Recent Labs  Lab 02/26/20 0839 02/27/20 0144 02/28/20 0657  NA 133* 134* 134*  K 3.2* 3.1* 3.5  CL 102 102 98  CO2 21* 23 26  GLUCOSE 103* 97 95  BUN 5* 5* <5*  CREATININE 0.59 0.67 0.63  CALCIUM 8.8* 8.3* 8.6*  MG  --   --  2.3    GFR: Estimated Creatinine Clearance: 152.1 mL/min (by C-G formula based on SCr of 0.63 mg/dL).  Liver Function Tests: Recent Labs  Lab 02/26/20 0839  AST 12*  ALT 15  ALKPHOS 64  BILITOT 1.1  PROT 7.3  ALBUMIN 3.7    CBG: No results for input(s): GLUCAP in the last 168 hours.   Recent Results (from the past 240 hour(s))  SARS Coronavirus 2 by RT PCR (hospital order, performed in [facility] hospital lab) Nasopharyngeal Nasopharyngeal Swab     Status: None   Collection Time:  02/26/20 10:07 AM   Specimen: Nasopharyngeal Swab  Result Value Ref Range Status   SARS Coronavirus 2 NEGATIVE NEGATIVE Final    Comment: (NOTE) SARS-CoV-2 target nucleic acids are NOT DETECTED.  The SARS-CoV-2 RNA is generally detectable in upper and lower respiratory specimens during the acute phase of infection. The lowest concentration of SARS-CoV-2 viral copies this assay can detect is 250 copies / mL. A negative result does not preclude SARS-CoV-2 infection and should not be used as the sole basis for treatment or other patient management decisions.  A negative result may occur with improper specimen collection / handling, submission of specimen other than nasopharyngeal swab, presence of viral mutation(s) within the areas targeted by this assay, and inadequate number of viral copies (<250 copies / mL). A negative result must be combined with clinical observations, patient history, and epidemiological information.  Fact Sheet for Patients:   BoilerBrush.com.cy  Fact Sheet for Healthcare Providers: https://pope.com/  This test is not yet approved or  cleared by the [place] FDA and has been authorized for detection and/or diagnosis of SARS-CoV-2 by FDA under an Emergency Use Authorization (EUA).  This EUA will remain  in effect (meaning this test can be used) for the duration of the COVID-19 declaration under Section 564(b)(1) of the Act, 21 U.S.C. section 360bbb-3(b)(1), unless the authorization is terminated or revoked sooner.  Performed at [facility], [address], [place], [place] [address]   Culture, blood (routine x 2)     Status: None (Preliminary result)   Collection Time: 02/27/20  1:44 AM   Specimen: Left Antecubital; Blood  Result Value Ref Range Status   Specimen Description LEFT ANTECUBITAL  Final   Special Requests   Final    BOTTLES DRAWN AEROBIC AND ANAEROBIC Blood Culture adequate volume Performed at  [facility], [address], [place], [place] [address]    Culture PENDING  Incomplete   Report Status PENDING  Incomplete  Culture, blood (routine x 2)     Status: None (Preliminary result)   Collection Time: 02/27/20  1:55 AM   Specimen: BLOOD LEFT HAND  Result Value Ref Range Status   Specimen Description BLOOD LEFT HAND  Final   Special Requests   Final    BOTTLES DRAWN AEROBIC AND ANAEROBIC Blood Culture adequate volume Performed at [facility], [address] [place], [place] [address]    Culture PENDING  Incomplete   Report Status PENDING  Incomplete      Radiology Studies: No results found.   Scheduled Meds: . heparin injection (subcutaneous)  5,000 Units Subcutaneous Q8H  . pantoprazole (PROTONIX) IV  40 mg Intravenous Q24H   Continuous Infusions: . dextrose 5 % and 0.45 % NaCl with KCl 40 mEq/L 100 mL/hr at 02/27/20 1825  . piperacillin-tazobactam 3.375 g (02/28/20 1204)     LOS: 2 days    Time spent: 30 minutes.    Vassie Loll, MD [facility]   To contact the attending provider between 7A-7P or the covering provider during after hours 7P-7A, please log into the web site [email or web address] and access using universal [place] password for that web site. If you do not have the password, please call the hospital operator.  02/28/2020, 1:22 PM

## 2020-02-28 NOTE — Progress Notes (Signed)
  Subjective: Pt reports feeling much improved today with increased energy and reduced abdominal pain. Her pain currently is 2/10 and crampy in the LLQ at rest and 3-4/10 with movement or to palpation. Received 5 mg oxycodone at 6:00 AM but no morphine since last night. She reports having a small hard bowel movement this morning and continues to pass bas. Denies a recurrence of any nausea or vomiting since yesterday. She has improved appetite this morning but had not had a chance to eat breakfast yet.  Objective: Vital signs in last 24 hours: Temp:  [98.4 F (36.9 C)-99.9 F (37.7 C)] 98.8 F (37.1 C) (08/24 0454) Pulse Rate:  [78-90] 78 (08/24 0454) Resp:  [18-24] 18 (08/24 0454) BP: (109-124)/(66-83) 109/66 (08/24 0454) SpO2:  [99 %-100 %] 100 % (08/24 0454) Last BM Date: 02/26/20  Physical Exam Constitutional: no acute distress, resting comfortably in bed Cardiovascular: regular rate and rhythm, no M/R/G Pulmonary: effort normal, normal breath sounds bilaterally Abdominal: mild tenderness to palpation LLQ, no rebound or guarding Skin: warm and dry Neurological: alert, no focal deficit Psychiatric: normal mood and affect  Lab Results:  Recent Labs    02/27/20 0144 02/28/20 0657  WBC 16.1* 8.5  HGB 11.1* 11.4*  HCT 35.5* 37.3  PLT 303 344   BMET Recent Labs    02/27/20 0144 02/28/20 0657  NA 134* 134*  K 3.1* 3.5  CL 102 98  CO2 23 26  GLUCOSE 97 95  BUN 5* <5*  CREATININE 0.67 0.63  CALCIUM 8.3* 8.6*    Anti-infectives: Anti-infectives (From admission, onward)   Start     Dose/Rate Route Frequency Ordered Stop   02/26/20 1400  piperacillin-tazobactam (ZOSYN) IVPB 3.375 g        3.375 g 12.5 mL/hr over 240 Minutes Intravenous Every 8 hours 02/26/20 1013     02/26/20 1000  piperacillin-tazobactam (ZOSYN) IVPB 3.375 g        3.375 g 100 mL/hr over 30 Minutes Intravenous  Once 02/26/20 0954 02/26/20 1125      Assessment/Plan: Pt having significant  improvements in pain, energy level, and appetite. Afebrile overnight with no tylenol. WBC down from 16.1 to 8.5 and hemodynamically stable. Pt will continue day 3 of Zosyn today and transition to PO Augmentin at discharge. Progress diet as tolerated today. -clear diet -Roxicodone q4h PRN for pain -Zosyn 3.375g q8h, Day 3, followed by PO Augmentin at discharge   LOS: 2 days    Nonah Mattes 02/28/2020

## 2020-02-29 DIAGNOSIS — R198 Other specified symptoms and signs involving the digestive system and abdomen: Secondary | ICD-10-CM | POA: Diagnosis not present

## 2020-02-29 DIAGNOSIS — K5720 Diverticulitis of large intestine with perforation and abscess without bleeding: Secondary | ICD-10-CM | POA: Diagnosis not present

## 2020-02-29 LAB — BASIC METABOLIC PANEL
Anion gap: 8 (ref 5–15)
BUN: 5 mg/dL — ABNORMAL LOW (ref 6–20)
CO2: 26 mmol/L (ref 22–32)
Calcium: 8.7 mg/dL — ABNORMAL LOW (ref 8.9–10.3)
Chloride: 100 mmol/L (ref 98–111)
Creatinine, Ser: 0.73 mg/dL (ref 0.44–1.00)
GFR calc Af Amer: >60 mL/min (ref >60)
GFR calc non Af Amer: >60 mL/min (ref >60)
Glucose, Bld: 91 mg/dL (ref 70–99)
Potassium: 3.7 mmol/L (ref 3.5–5.1)
Sodium: 134 mmol/L — ABNORMAL LOW (ref 135–145)

## 2020-02-29 LAB — CBC
HCT: 36.8 % (ref 36.0–46.0)
Hemoglobin: 11.2 g/dL — ABNORMAL LOW (ref 12.0–15.0)
MCH: 26.7 pg (ref 26.0–34.0)
MCHC: 30.4 g/dL (ref 30.0–36.0)
MCV: 87.6 fL (ref 80.0–100.0)
Platelets: 362 10*3/uL (ref 150–400)
RBC: 4.2 MIL/uL (ref 3.87–5.11)
RDW: 16.1 % — ABNORMAL HIGH (ref 11.5–15.5)
WBC: 6.5 10*3/uL (ref 4.0–10.5)
nRBC: 0 % (ref 0.0–0.2)

## 2020-02-29 LAB — MAGNESIUM: Magnesium: 2.1 mg/dL (ref 1.7–2.4)

## 2020-02-29 MED ORDER — AMOXICILLIN-POT CLAVULANATE 875-125 MG PO TABS: 1.0000 | ORAL_TABLET | Freq: Two times a day (BID) | ORAL | 0 refills | Status: DC

## 2020-02-29 MED ORDER — OXYCODONE HCL 5 MG PO TABS: 5.0000 mg | ORAL_TABLET | ORAL | 0 refills | Status: DC | PRN

## 2020-02-29 MED ORDER — AMOXICILLIN-POT CLAVULANATE 875-125 MG PO TABS
1.0000 | ORAL_TABLET | Freq: Two times a day (BID) | ORAL | Status: DC
  Administered 2020-02-29: 1 via ORAL
  Filled 2020-02-29: qty 1

## 2020-02-29 NOTE — Progress Notes (Addendum)
I was present with the medical student for this service. I personally verified the history of present illness, performed the physical exam, and made the plan for this encounter. I have verified the medical student's documentation and made modifications where appropriately. I have personally documented in my own words a brief history, physical, and plan below.     Feeling better. Tolerated diet and had BM. Localized rash but doubt it is from antibiotics.  Soft, nondistended, minimally tender  Home with oral antibiotics to complete 10 day total course Plan for outpatient visit to discuss options  Will need referral for colonoscopy given first episode. Home with some roxicodone for pain control.   Algis Greenhouse, MD [facility] [address] [place], [place] [address] [phone] (office)   Subjective: Ms. Linhart reports feeling very well this morning and is in good spirits. She reports that she no longer has pain at rest and has 1/10 dull pain to palpation of her LLQ. She had a BM yesterday that was normal in consistency and volume for her. Tolerated liquid diet well today with no nausea, vomiting, or abdominal pain.  She notes an erythematous rash on her abdomen and left leg that developed yesterday. Early this morning she reports having some generalized itchiness on her back and legs. She denies any difficulty breathing, swelling or itchiness in her mouth or throat, or changes in sensation in her mouth. She does note that she has sensitive skin and is prone to rashes. Instructed her to inform her nurse if she notices this rash worsening or begins to feel any swelling or itchiness around her mouth or throat.   Objective: Vital signs in last 24 hours: Temp:  [98 F (36.7 C)-98.5 F (36.9 C)] 98.5 F (36.9 C) (08/25 0342) Pulse Rate:  [73-81] 81 (08/25 0342) Resp:  [18-24] 18 (08/25 0342) BP: (107-120)/(64-82) 107/64 (08/25 0342) SpO2:  [100 %] 100 %  (08/25 0342) Last BM Date: 02/26/20  Intake/Output from previous day: 08/24 0701 - 08/25 0700 In: 2224.8 [P.O.:720; I.V.:1200; IV Piggyback:304.8] Out: 600 [Urine:600] Intake/Output this shift: No intake/output data recorded.  Physical Exam Constitutional: no acute distress, resting comfortably in bed Cardiovascular: regular rate and rhythm, noM/R/G Pulmonary: effort normal, normal breath sounds bilaterally Abdominal:1/10 dull pain to palpation LLQ, no pain elsewhere in the abdomen, no rebound or guarding Skin: warm and dry, nonpainful erythematous rash on abdomen and left leg, fine vesicular appearance Neurological: alert, no focal deficit Psychiatric: normal mood and affect  Lab Results:  Recent Labs    02/27/20 0144 02/28/20 0657  WBC 16.1* 8.5  HGB 11.1* 11.4*  HCT 35.5* 37.3  PLT 303 344   BMET Recent Labs    02/27/20 0144 02/28/20 0657  NA 134* 134*  K 3.1* 3.5  CL 102 98  CO2 23 26  GLUCOSE 97 95  BUN 5* <5*  CREATININE 0.67 0.63  CALCIUM 8.3* 8.6*    Anti-infectives: Anti-infectives (From admission, onward)   Start     Dose/Rate Route Frequency Ordered Stop   02/26/20 1400  piperacillin-tazobactam (ZOSYN) IVPB 3.375 g        3.375 g 12.5 mL/hr over 240 Minutes Intravenous Every 8 hours 02/26/20 1013     02/26/20 1000  piperacillin-tazobactam (ZOSYN) IVPB 3.375 g        3.375 g 100 mL/hr over 30 Minutes Intravenous  Once 02/26/20 0954 02/26/20 1125      Assessment/Plan: Pt continues to feel well and afebrile overnight. She reports having  a normal bowel movement yesterday. Her pain has improved significantly today and is requiring less pain medication. Rash as described above possibly due to contact dermatitis but cannot rule out developing penicillin allergy, will alter home abx regimen to cipro/flagyl to avoid augmentin. Will progress to soft diet today and assuming she tolerates this well, continues to have good pain control, and bowel movements she  will likely go home today on PO Cipro/flagyl. -soft diet -Roxicodone q4h PRN for pain -d/c Zosyn -start PO Cipro 500 mg BID, PO Flagyl 500 mg q8h for 7 days    LOS: 3 days    Nonah Mattes 02/29/2020

## 2020-02-29 NOTE — Progress Notes (Signed)
Discharge instructions reviewed with patient and scripts sent to pharmacy. No IV to remove.  Mother to drive home.

## 2020-02-29 NOTE — Discharge Instructions (Signed)
Diverticulosis/ Diverticulitis Information and Diet:   Diverticulosis is a condition in which small, bulging pouches (diverticuli) form inside the lower part of the intestine, usually in the colon. Constipation and straining during bowel movements can worsen the condition. A diet rich in fiber can help keep stools soft and prevent inflammation.  Diverticulitis occurs when the pouches in the colon become infected or inflamed. Dietary changes can help the colon heal.  Fiber is an important part of the diet for patients with diverticulosis. A high-fiber diet softens and gives bulk to the stool, allowing it to pass quickly and easily.  Diet for Diverticulosis Eat a high-fiber diet when you have diverticulosis. Fiber softens the stool and helps prevent constipation. It also can help decrease pressure in the colon and help prevent flare-ups of diverticulitis.  High-fiber foods include:  Beans and legumes Bran, whole wheat bread and whole grain cereals such as oatmeal Brown and wild rice Fruits such as apples, bananas and pears Vegetables such as broccoli, carrots, corn and squash Whole wheat pasta If you currently don't have a diet high in fiber, you should add fiber gradually. This helps avoid bloating and abdominal discomfort. The target is to eat 25 to 30 grams of fiber daily. Drink at least 8 cups of fluid daily. Fluid will help soften your stool. Exercise also promotes bowel movement and helps prevent constipation.  When the colon is not inflamed, eat popcorn, nuts and seeds as tolerated.  Diet for Diverticulitis During flare ups of diverticulitis, follow a clear liquid diet. Your doctor will let you know when to progress from clear liquids to low fiber solids and then back to your normal diet.  A clear liquid diet means no solid foods. Juices should have no pulp. During the clear liquid diet, you may consume:  Broth Clear juices such as apple, cranberry and grape. (Avoid orange  juice) Jell-O Popsicles  When you're able to eat solid food, choose low fiber foods while healing. Low fiber foods include:  Canned or cooked fruit without seeds or skin, such as applesauce and melon Canned or well cooked vegetables without seeds and skin Dairy products such as cheese, milk and yogurt Eggs Low-fiber cereal Meat that is ground or tender and well cooked Pasta White bread and white rice AVOID RED MEAT WHILE YOU HAVE AN ACTIVE FLARE.  AVOID NSAIDS, IBUPROFEN, ALEVE, ASPIRIN WHILE YOU HAVE AN ACTIVE FLARE.  EXERCISE AS WELL AS SMOKING CESSATION CAN HELP PREVENT RECURRENCES.   After symptoms improve, usually within two to four days, you may add 5 to 15 grams of fiber a day back into your diet. Resume your high fiber diet when you no longer have symptoms.  

## 2020-02-29 NOTE — Discharge Summary (Signed)
Physician Discharge Summary  [name] HCW:[id number] DOB: 11-Aug-1985 DOA: 02/26/2020  PCP: [facility]  Admit date: 02/26/2020 Discharge date: 02/29/2020  Admitted From: Home Disposition:  Home  Recommendations for Outpatient Follow-up:  1. Follow up with PCP in 1-2 weeks 2. Please obtain BMP/CBC in one week    Discharge Condition: Stable CODE STATUS: FULL Diet recommendation: soft   Brief/Interim Summary: Peggy Penafiel Kingis a 34 y.o.femalewith medical history significant ofwith past medical history significant for prior history of cholecystectomy, knee osteoarthritis and morbid obesity;who presented to the hospital secondary to abdominal pain, nausea and vomiting. Patient reports symptoms started approximately 3-4 days and worsening. She expressed pain in the lower aspect of her abdomen, 7-8out of 10 in intensity,crampy in nature, associated with poor appetite, nausea, dry heaving/vomiting and constipation.Patient reports pain is radiated to the lower aspect of her left back. Patient denies dysuria, hematuria, melena, hematochezia, hematemesis, fever, chills, headaches, focal neurologic deficits or any other complaints. No sick contacts.  Covid test check in the ED and negative.  ED Course:CT abdomen and pelvis demonstrated sigmoid diverticulitis with contained perforation, mild hyponatremia/hypokalemia and elevated WBCs appreciated on the blood work. Patient received IV fluids, IV antibiotics [facility] has been contacted for further evaluation and management. General surgery service was also called at time of admission.   Discharge Diagnoses:  sigmoid diverticulitis with contained perforation -Still having abdominal pain, but improving; no further fever, normal WBC's  -slowly advancing diet-->tolerated soft diet on day of dc -Continue current IV antibiotics and continue as needed antiemetics and analgesics. -zosyn-->amox/clav x 7 more days to  complete 10 days -d/c home with oxycodone 5 mg #12 -Continued IV fluids -case discussed with Dr. Henreitta Leber on day of d/c-->ok to d/c home with amox/clav  dehydration, hyponatremia, hypokalemia -Continue IV fluids as mentioned above -Continue repleting electrolytes and follow trend.  obesity, class III -Body mass index is 41.39 kg/m. -Low calorie diet, portion control and increase physical activity discussed with patient.  gastroesophageal reflux disease -Continue PPI.  leukocytosis -in the setting of stress demargination and #1 -White blood cells down and within normal limits currently WBC 6.5 on day of d/c  Discharge Instructions   Allergies as of 02/29/2020   No Known Allergies     Medication List    STOP taking these medications   doxycycline 100 MG capsule Commonly known as: VIBRAMYCIN   fluconazole 150 MG tablet Commonly known as: Diflucan   nystatin cream Commonly known as: MYCOSTATIN   triamcinolone cream 0.1 % Commonly known as: KENALOG     TAKE these medications   amoxicillin-clavulanate 875-125 MG tablet Commonly known as: AUGMENTIN Take 1 tablet by mouth every 12 (twelve) hours.   ibuprofen 800 MG tablet Commonly known as: ADVIL Take 800 mg by mouth every 6 (six) hours as needed.   naproxen sodium 220 MG tablet Commonly known as: ALEVE Take 220 mg by mouth daily as needed.   Nexplanon 68 MG Impl implant Generic drug: etonogestrel 1 each by Subdermal route once.   ondansetron 4 MG tablet Commonly known as: ZOFRAN Take 1 tablet (4 mg total) by mouth every 6 (six) hours. As needed for nausea vomiting   oxyCODONE 5 MG immediate release tablet Commonly known as: Oxy IR/ROXICODONE Take 1 tablet (5 mg total) by mouth every 4 (four) hours as needed for moderate pain.       Follow-up Information    Lucretia Roers, MD Follow up on 03/08/2020.   Specialty: General Surgery  Why: follow up after diverticulitis  Contact information: [address]  [address] [place] [facility] [id number] [phone]              No Known Allergies  Consultations:  General surgery   Procedures/Studies: CT ABDOMEN PELVIS W CONTRAST  Result Date: 02/26/2020 CLINICAL DATA:  Lower abdominal pain with swelling and constipation. Nausea and vomiting for 3 days. EXAM: CT ABDOMEN AND PELVIS WITH CONTRAST TECHNIQUE: Multidetector CT imaging of the abdomen and pelvis was performed using the standard protocol following bolus administration of intravenous contrast. CONTRAST:  OMNIPAQUE IOHEXOL 300 MG/ML  SOLN COMPARISON:  None. FINDINGS: Lower chest:  No contributory findings. Hepatobiliary: No focal liver abnormality.Cholecystectomy. Pancreas: Unremarkable. Spleen: Unremarkable. Adrenals/Urinary Tract: Negative adrenals. No hydronephrosis or stone. Unremarkable bladder. Stomach/Bowel: Left colonic diverticulosis with inflammation around a sigmoid diverticulum, complicated by extraluminal gas with a 17 mm span. No obstruction. No appendicitis. Vascular/Lymphatic: No acute vascular abnormality. No mass or adenopathy. Reproductive:No pathologic findings. Other: No ascites or pneumoperitoneum. Musculoskeletal: No acute abnormalities. Lower lumbar facet degeneration. IMPRESSION: Sigmoid diverticulitis complicated by contained perforation (local mesenteric gas without fluid collection). Electronically Signed   By: Marnee Spring M.D.   On: 02/26/2020 09:46         Discharge Exam: Vitals:   02/29/20 0342 02/29/20 1349  BP: 107/64 119/81  Pulse: 81 87  Resp: 18 17  Temp: 98.5 F (36.9 C) 98.3 F (36.8 C)  SpO2: 100% 98%   Vitals:   02/28/20 1500 02/28/20 2150 02/29/20 0342 02/29/20 1349  BP: 111/67 120/82 107/64 119/81  Pulse: 73 73 81 87  Resp: 18 (!) 24 18 17   Temp: 98 F (36.7 C) 98 F (36.7 C) 98.5 F (36.9 C) 98.3 F (36.8 C)  TempSrc: Oral Oral Oral Oral  SpO2: 100% 100% 100% 98%  Weight:      Height:        General: Pt is alert,  awake, not in acute distress Cardiovascular: RRR, S1/S2 +, no rubs, no gallops Respiratory: CTA bilaterally, no wheezing, no rhonchi Abdominal: Soft, NT, ND, bowel sounds + Extremities: no edema, no cyanosis   The results of significant diagnostics from this hospitalization (including imaging, microbiology, ancillary and laboratory) are listed below for reference.    Significant Diagnostic Studies: CT ABDOMEN PELVIS W CONTRAST  Result Date: 02/26/2020 CLINICAL DATA:  Lower abdominal pain with swelling and constipation. Nausea and vomiting for 3 days. EXAM: CT ABDOMEN AND PELVIS WITH CONTRAST TECHNIQUE: Multidetector CT imaging of the abdomen and pelvis was performed using the standard protocol following bolus administration of intravenous contrast. CONTRAST:  02/28/2020 OMNIPAQUE IOHEXOL 300 MG/ML  SOLN COMPARISON:  None. FINDINGS: Lower chest:  No contributory findings. Hepatobiliary: No focal liver abnormality.Cholecystectomy. Pancreas: Unremarkable. Spleen: Unremarkable. Adrenals/Urinary Tract: Negative adrenals. No hydronephrosis or stone. Unremarkable bladder. Stomach/Bowel: Left colonic diverticulosis with inflammation around a sigmoid diverticulum, complicated by extraluminal gas with a 17 mm span. No obstruction. No appendicitis. Vascular/Lymphatic: No acute vascular abnormality. No mass or adenopathy. Reproductive:No pathologic findings. Other: No ascites or pneumoperitoneum. Musculoskeletal: No acute abnormalities. Lower lumbar facet degeneration. IMPRESSION: Sigmoid diverticulitis complicated by contained perforation (local mesenteric gas without fluid collection). Electronically Signed   By: M.D.   On: 02/26/2020 09:46     Microbiology: Recent Results (from the past 240 hour(s))  SARS Coronavirus 2 by RT PCR (hospital order, performed in [facility] hospital lab) Nasopharyngeal Nasopharyngeal Swab     Status: None   Collection Time: 02/26/20 10:07 AM  Specimen:  Nasopharyngeal Swab  Result Value Ref Range Status   SARS Coronavirus 2 NEGATIVE NEGATIVE Final    Comment: (NOTE) SARS-CoV-2 target nucleic acids are NOT DETECTED.  The SARS-CoV-2 RNA is generally detectable in upper and lower respiratory specimens during the acute phase of infection. The lowest concentration of SARS-CoV-2 viral copies this assay can detect is 250 copies / mL. A negative result does not preclude SARS-CoV-2 infection and should not be used as the sole basis for treatment or other patient management decisions.  A negative result may occur with improper specimen collection / handling, submission of specimen other than nasopharyngeal swab, presence of viral mutation(s) within the areas targeted by this assay, and inadequate number of viral copies (<250 copies / mL). A negative result must be combined with clinical observations, patient history, and epidemiological information.  Fact Sheet for Patients:   BoilerBrush.com.cyhttps://www.fda.gov/media/136312/download  Fact Sheet for Healthcare Providers: https://pope.com/https://www.fda.gov/media/136313/download  This test is not yet approved or  cleared by the [place] FDA and has been authorized for detection and/or diagnosis of SARS-CoV-2 by FDA under an Emergency Use Authorization (EUA).  This EUA will remain in effect (meaning this test can be used) for the duration of the COVID-19 declaration under Section 564(b)(1) of the Act, 21 U.S.C. section 360bbb-3(b)(1), unless the authorization is terminated or revoked sooner.  Performed at [facility], [address] [place], [place] [address]   Culture, blood (routine x 2)     Status: None (Preliminary result)   Collection Time: 02/27/20  1:44 AM   Specimen: Left Antecubital; Blood  Result Value Ref Range Status   Specimen Description LEFT ANTECUBITAL  Final   Special Requests   Final    BOTTLES DRAWN AEROBIC AND ANAEROBIC Blood Culture adequate volume   Culture   Final    NO GROWTH 2  DAYS Performed at [facility], [address] [place], [place] [address]    Report Status PENDING  Incomplete  Culture, blood (routine x 2)     Status: None (Preliminary result)   Collection Time: 02/27/20  1:55 AM   Specimen: BLOOD LEFT HAND  Result Value Ref Range Status   Specimen Description BLOOD LEFT HAND  Final   Special Requests   Final    BOTTLES DRAWN AEROBIC AND ANAEROBIC Blood Culture adequate volume   Culture   Final    NO GROWTH 2 DAYS Performed at [facility], [address] [place], [place] [address]    Report Status PENDING  Incomplete     Labs: Basic Metabolic Panel: Recent Labs  Lab 02/26/20 0839 02/26/20 0839 02/27/20 0144 02/27/20 0144 02/28/20 0657 02/29/20 0649  NA 133*  --  134*  --  134* 134*  K 3.2*   < > 3.1*   < > 3.5 3.7  CL 102  --  102  --  98 100  CO2 21*  --  23  --  26 26  GLUCOSE 103*  --  97  --  95 91  BUN 5*  --  5*  --  <5* 5*  CREATININE 0.59  --  0.67  --  0.63 0.73  CALCIUM 8.8*  --  8.3*  --  8.6* 8.7*  MG  --   --   --   --  2.3 2.1   < > = values in this interval not displayed.   Liver Function Tests: Recent Labs  Lab 02/26/20 0839  AST 12*  ALT 15  ALKPHOS 64  BILITOT 1.1  PROT 7.3  ALBUMIN 3.7   No results for input(s): LIPASE, AMYLASE in the last 168 hours. No results for input(s): AMMONIA in the last 168 hours. CBC: Recent Labs  Lab 02/26/20 0839 02/27/20 0144 02/28/20 0657 02/29/20 0649  WBC 15.6* 16.1* 8.5 6.5  NEUTROABS 11.9*  --  5.0  --   HGB 11.5* 11.1* 11.4* 11.2*  HCT 36.9 35.5* 37.3 36.8  MCV 86.8 86.8 88.2 87.6  PLT 309 303 344 362   Cardiac Enzymes: No results for input(s): CKTOTAL, CKMB, CKMBINDEX, TROPONINI in the last 168 hours. BNP: Invalid input(s): POCBNP CBG: No results for input(s): GLUCAP in the last 168 hours.  Time coordinating discharge:  36 minutes  Signed:  Catarina Hartshorn, DO [facility] Pager: [phone] 02/29/2020, 5:10 PM

## 2020-03-03 LAB — CULTURE, BLOOD (ROUTINE X 2)
Culture: NO GROWTH
Culture: NO GROWTH
Special Requests: ADEQUATE
Special Requests: ADEQUATE

## 2020-03-07 DIAGNOSIS — Z419 Encounter for procedure for purposes other than remedying health state, unspecified: Secondary | ICD-10-CM | POA: Diagnosis not present

## 2020-03-08 ENCOUNTER — Encounter: Payer: Self-pay | Admitting: General Surgery

## 2020-03-08 ENCOUNTER — Other Ambulatory Visit: Payer: Self-pay

## 2020-03-08 ENCOUNTER — Ambulatory Visit (INDEPENDENT_AMBULATORY_CARE_PROVIDER_SITE_OTHER): Payer: Medicaid Other | Admitting: General Surgery

## 2020-03-08 VITALS — BP 123/82 | HR 81 | Temp 98.3°F | Resp 16 | Ht 71.0 in | Wt 298.0 lb

## 2020-03-08 DIAGNOSIS — K5720 Diverticulitis of large intestine with perforation and abscess without bleeding: Secondary | ICD-10-CM | POA: Diagnosis not present

## 2020-03-08 DIAGNOSIS — B373 Candidiasis of vulva and vagina: Secondary | ICD-10-CM | POA: Diagnosis not present

## 2020-03-08 DIAGNOSIS — B3731 Acute candidiasis of vulva and vagina: Secondary | ICD-10-CM

## 2020-03-08 MED ORDER — FLUCONAZOLE 50 MG PO TABS: 150.0000 mg | ORAL_TABLET | Freq: Once | ORAL | 0 refills | Status: AC

## 2020-03-08 NOTE — Patient Instructions (Signed)
Referral to GI for colonoscopy.  Diverticulosis/ Diverticulitis Information and Diet:   Diverticulosis is a condition in which small, bulging pouches (diverticuli) form inside the lower part of the intestine, usually in the colon. Constipation and straining during bowel movements can worsen the condition. A diet rich in fiber can help keep stools soft and prevent inflammation.  Diverticulitis occurs when the pouches in the colon become infected or inflamed. Dietary changes can help the colon heal.  Fiber is an important part of the diet for patients with diverticulosis. A high-fiber diet softens and gives bulk to the stool, allowing it to pass quickly and easily.  Diet for Diverticulosis Eat a high-fiber diet when you have diverticulosis. Fiber softens the stool and helps prevent constipation. It also can help decrease pressure in the colon and help prevent flare-ups of diverticulitis.  High-fiber foods include:  Beans and legumes Bran, whole wheat bread and whole grain cereals such as oatmeal Brown and wild rice Fruits such as apples, bananas and pears Vegetables such as broccoli, carrots, corn and squash Whole wheat pasta If you currently don't have a diet high in fiber, you should add fiber gradually. This helps avoid bloating and abdominal discomfort. The target is to eat 25 to 30 grams of fiber daily. Drink at least 8 cups of fluid daily. Fluid will help soften your stool. Exercise also promotes bowel movement and helps prevent constipation.  When the colon is not inflamed, eat popcorn, nuts and seeds as tolerated.  Diet for Diverticulitis During flare ups of diverticulitis, follow a clear liquid diet. Your doctor will let you know when to progress from clear liquids to low fiber solids and then back to your normal diet.  A clear liquid diet means no solid foods. Juices should have no pulp. During the clear liquid diet, you may consume:  Broth Clear juices such as apple, cranberry  and grape. (Avoid orange juice) Jell-O Popsicles  When you're able to eat solid food, choose low fiber foods while healing. Low fiber foods include:  Canned or cooked fruit without seeds or skin, such as applesauce and melon Canned or well cooked vegetables without seeds and skin Dairy products such as cheese, milk and yogurt Eggs Low-fiber cereal Meat that is ground or tender and well cooked Pasta White bread and white rice AVOID RED MEAT WHILE YOU HAVE AN ACTIVE FLARE.  AVOID NSAIDS, IBUPROFEN, ALEVE, ASPIRIN WHILE YOU HAVE AN ACTIVE FLARE.  EXERCISE AS WELL AS SMOKING CESSATION CAN HELP PREVENT RECURRENCES.   After symptoms improve, usually within two to four days, you may add 5 to 15 grams of fiber a day back into your diet. Resume your high fiber diet when you no longer have symptoms.

## 2020-03-08 NOTE — Progress Notes (Signed)
[facility] Note   HPI:  34 y.o. Female presents to clinic for follow-up evaluation after perforated diverticulitis admission. She responded to antibiotics and did not require a drain. She is doing well and feeling better.  Review of Systems:  No fevers or chills No recent nausea or vomiting Pain improving  Yeast infection/ drainage vaginally  All other review of systems: otherwise negative   Vital Signs:  BP 123/82   Pulse 81   Temp 98.3 F (36.8 C) (Oral)   Resp 16   Ht 5\' 11"  (1.803 m)   Wt 298 lb (135.2 kg)   BMI 41.56 kg/m    Physical Exam:  Physical Exam Vitals reviewed.  Cardiovascular:     Rate and Rhythm: Normal rate.  Pulmonary:     Effort: Pulmonary effort is normal.  Abdominal:     General: There is no distension.     Palpations: Abdomen is soft.     Tenderness: There is no abdominal tenderness.     Assessment:  34 y.o. yo Female with her first episode of diverticulitis. Will need a colonoscopy for baseline evaluation and to ensure no other pathology.  Plan:  -Referral to GI for colonoscopy -Discussed risk of diverticulitis and risk of recurrence. Discussed personalized/ individualized approach to this regarding surgery and discussion in the future.  -Diflucan 150mg  once sent in for yeast infection, probiotic encouraged with antibiotic   Future Appointments  Date Time Provider Department Center  04/19/2020  2:45 PM , MD [facility] None     04/21/2020, MD [facility] [address] [facility] [place], [place] [phone] (office)

## 2020-03-13 ENCOUNTER — Encounter: Payer: Self-pay | Admitting: Family Medicine

## 2020-04-06 DIAGNOSIS — Z419 Encounter for procedure for purposes other than remedying health state, unspecified: Secondary | ICD-10-CM | POA: Diagnosis not present

## 2020-04-19 ENCOUNTER — Ambulatory Visit: Payer: Medicaid Other | Admitting: General Surgery

## 2020-05-07 DIAGNOSIS — Z419 Encounter for procedure for purposes other than remedying health state, unspecified: Secondary | ICD-10-CM | POA: Diagnosis not present

## 2020-06-06 DIAGNOSIS — Z419 Encounter for procedure for purposes other than remedying health state, unspecified: Secondary | ICD-10-CM | POA: Diagnosis not present

## 2020-07-07 DIAGNOSIS — Z419 Encounter for procedure for purposes other than remedying health state, unspecified: Secondary | ICD-10-CM | POA: Diagnosis not present

## 2020-07-16 ENCOUNTER — Other Ambulatory Visit: Payer: Self-pay

## 2020-07-16 ENCOUNTER — Emergency Department (HOSPITAL_COMMUNITY)
Admission: EM | Admit: 2020-07-16 | Discharge: 2020-07-16 | Disposition: A | Discharge status: Home / Self Care | Payer: Medicaid Other | Attending: Emergency Medicine | Admitting: Emergency Medicine

## 2020-07-16 ENCOUNTER — Encounter (HOSPITAL_COMMUNITY): Payer: Self-pay

## 2020-07-16 DIAGNOSIS — U071 COVID-19: Secondary | ICD-10-CM | POA: Insufficient documentation

## 2020-07-16 DIAGNOSIS — B349 Viral infection, unspecified: Secondary | ICD-10-CM

## 2020-07-16 DIAGNOSIS — F17210 Nicotine dependence, cigarettes, uncomplicated: Secondary | ICD-10-CM | POA: Diagnosis not present

## 2020-07-16 DIAGNOSIS — H9202 Otalgia, left ear: Secondary | ICD-10-CM | POA: Diagnosis not present

## 2020-07-16 DIAGNOSIS — R509 Fever, unspecified: Secondary | ICD-10-CM | POA: Diagnosis present

## 2020-07-16 HISTORY — DX: Perforation of intestine (nontraumatic): K63.1

## 2020-07-16 HISTORY — DX: Diverticulitis of intestine, part unspecified, without perforation or abscess without bleeding: K57.92

## 2020-07-16 LAB — GROUP A STREP BY PCR: Group A Strep by PCR: NOT DETECTED

## 2020-07-16 LAB — SARS CORONAVIRUS 2 (TAT 6-24 HRS): SARS Coronavirus 2: POSITIVE — AB

## 2020-07-16 MED ORDER — ACETAMINOPHEN 325 MG PO TABS
650.0000 mg | ORAL_TABLET | Freq: Once | ORAL | Status: AC | PRN
  Administered 2020-07-16: 650 mg via ORAL
  Filled 2020-07-16: qty 2

## 2020-07-16 NOTE — ED Triage Notes (Signed)
Patient c/o body aches, fever, headache,  Sore throat, and diarrhea since yesterday.

## 2020-07-16 NOTE — Discharge Instructions (Signed)
Your COVID test result will be available later today.  Check it on MyChart.  For your symptoms, use Tylenol for fever, gargle with salt water for sore throat, make sure you are getting plenty of rest and drinking a lot of fluids.  If your COVID test is positive you will have to isolate yourself from other people for at least 5 days, longer if you have persistent symptoms.  Plan on staying out of work for at least a week.

## 2020-07-16 NOTE — ED Provider Notes (Signed)
[facility]-EMERGENCY DEPT Provider Note   CSN: [id number] Arrival date & time: 07/16/20  0930     History Chief Complaint  Patient presents with  . Generalized Body Aches  . Fever  . Headache  . possible Covid     Peggy Rodriguez is a 35 y.o. female.  HPI She complains of illness that started yesterday characterized by fever, chills and achiness.  She also has left ear pain and sore throat.  She denies shortness of breath, weakness or dizziness.  She has sick contacts, in her home, and who visited her.  She is currently employed.  Past Medical History:  Diagnosis Date  . Colon perforation (HCC)   . Diverticulitis     Patient Active Problem List   Diagnosis Date Noted  . Vaginal yeast infection 03/08/2020  . Perforated viscus   . Hydradenitis 02/27/2020  . Diverticulitis of colon with perforation 02/26/2020  . Obesity, Class III, BMI 40-49.9 (morbid obesity) (HCC) 02/26/2020  . GERD (gastroesophageal reflux disease) 02/26/2020    Past Surgical History:  Procedure Laterality Date  . CHOLECYSTECTOMY    . KNEE SURGERY       OB History   No obstetric history on file.     Family History  Problem Relation Age of Onset  . Hypertension Mother   . Coronary artery disease Mother   . Thyroid disease Mother     Social History   Tobacco Use  . Smoking status: Current Some Day Smoker    Types: Cigarettes  . Smokeless tobacco: Never Used  Vaping Use  . Vaping Use: Never used  Substance Use Topics  . Alcohol use: No  . Drug use: Not Currently    Home Medications Prior to Admission medications   Medication Sig Start Date End Date Taking? Authorizing Provider  etonogestrel (NEXPLANON) 68 MG IMPL implant 1 each by Subdermal route once.    [provider]  ibuprofen (ADVIL) 800 MG tablet Take 800 mg by mouth every 6 (six) hours as needed.    [provider]  naproxen sodium (ALEVE) 220 MG tablet Take 220 mg by mouth daily as  needed.    [provider]    Allergies    Patient has no known allergies.  Review of Systems   Review of Systems  All other systems reviewed and are negative.   Physical Exam Updated Vital Signs BP (!) 143/95 (BP Location: Left Arm)   Pulse 84   Temp (!) 100.4 F (38 C) (Oral)   Resp 16   Ht 5\' 11"  (1.803 m)   Wt (!) 139.3 kg   SpO2 100%   BMI 42.82 kg/m   Physical Exam Vitals and nursing note reviewed.  Constitutional:      Appearance: She is well-developed and well-nourished.  HENT:     Head: Normocephalic and atraumatic.     Right Ear: External ear normal.     Left Ear: External ear normal.     Nose: No congestion or rhinorrhea.     Mouth/Throat:     Pharynx: No oropharyngeal exudate or posterior oropharyngeal erythema.  Eyes:     Extraocular Movements: EOM normal.     Conjunctiva/sclera: Conjunctivae normal.     Pupils: Pupils are equal, round, and reactive to light.  Neck:     Trachea: Phonation normal.  Cardiovascular:     Rate and Rhythm: Normal rate and regular rhythm.  Pulmonary:     Effort: Pulmonary effort is normal.  Chest:     Chest wall: No bony tenderness.  Abdominal:     General: There is no distension.     Palpations: Abdomen is soft.  Musculoskeletal:        General: Normal range of motion.     Cervical back: Normal range of motion and neck supple.  Skin:    General: Skin is warm, dry and intact.  Neurological:     Mental Status: She is alert and oriented to person, place, and time.     Cranial Nerves: No cranial nerve deficit.     Sensory: No sensory deficit.     Motor: No abnormal muscle tone.     Coordination: Coordination normal.  Psychiatric:        Mood and Affect: Mood and affect and mood normal.        Behavior: Behavior normal.        Thought Content: Thought content normal.        Judgment: Judgment normal.     ED Results / Procedures / Treatments   Labs (all labs ordered are listed, but only abnormal results  are displayed) Labs Reviewed  GROUP A STREP BY PCR  SARS CORONAVIRUS 2 (TAT 6-24 HRS)    EKG None  Radiology No results found.  Procedures Procedures (including critical care time)  Medications Ordered in ED Medications  acetaminophen (TYLENOL) tablet 650 mg (650 mg Oral Given 07/16/20 1029)    ED Course  I have reviewed the triage vital signs and the nursing notes.  Pertinent labs & imaging results that were available during my care of the patient were reviewed by me and considered in my medical decision making (see chart for details).    MDM Rules/Calculators/A&P                           Patient Vitals for the past 24 hrs:  BP Temp Temp src Pulse Resp SpO2 Height Weight  07/16/20 0945 (!) 143/95 (!) 100.4 F (38 C) Oral 84 16 100 % 5\' 11"  (1.803 m) (!) 139.3 kg    1:41 PM Reevaluation with update and discussion. After initial assessment and treatment, an updated evaluation reveals no change in status, findings discussed and questions answered.   Medical Decision Making:  This patient is presenting for evaluation of URI, which does not require a range of treatment options, and is not a complaint that involves a high risk of morbidity and mortality. The differential diagnoses include viral infection, pharyngitis. I decided to review old records, and in summary with a female presenting with acute illness, consistent with COVID infection.  I did not require additional historical information from anyone.  Clinical Laboratory Tests Ordered, included Covid-19 PCR, strep test. Review indicates negative strep, COVID pending at discharge.   Critical Interventions-clinical evaluation, laboratory testing, discussion with patient   Peggy Rodriguez was evaluated in Emergency Department on 07/16/2020 for the symptoms described in the history of present illness. She was evaluated in the context of the global COVID-19 pandemic, which necessitated consideration that the  patient might be at risk for infection with the SARS-CoV-2 virus that causes COVID-19. Institutional protocols and algorithms that pertain to the evaluation of patients at risk for COVID-19 are in a state of rapid change based on information released by regulatory bodies including the CDC and federal and state organizations. These policies and algorithms were followed during the patient's care in the ED.  After These  Interventions, the Patient was reevaluated and was found stable for discharge.  Patient requested strep testing however there is no clinical evidence for streptococcal infection, at this time.  Anticipate she can await COVID testing results at home.  CRITICAL CARE-no Performed by: Mancel Bale  Nursing Notes Reviewed/ Care Coordinated Applicable Imaging Reviewed Interpretation of Laboratory Data incorporated into ED treatment  The patient appears reasonably screened and/or stabilized for discharge and I doubt any other medical condition or other [facility] requiring further screening, evaluation, or treatment in the ED at this time prior to discharge.  Plan: Home Medications-OTC symptomatic treatment; Home Treatments-rest, fluids, isolate if COVID test is positive; return here if the recommended treatment, does not improve the symptoms; Recommended follow up-PCP, as needed.  Work release given for 1 week.     Final Clinical Impression(s) / ED Diagnoses Final diagnoses:  Viral syndrome    Rx / DC Orders ED Discharge Orders    None       Mancel Bale, MD 07/16/20 1342

## 2020-08-07 DIAGNOSIS — Z419 Encounter for procedure for purposes other than remedying health state, unspecified: Secondary | ICD-10-CM | POA: Diagnosis not present

## 2020-09-04 DIAGNOSIS — Z419 Encounter for procedure for purposes other than remedying health state, unspecified: Secondary | ICD-10-CM | POA: Diagnosis not present

## 2020-10-05 DIAGNOSIS — Z419 Encounter for procedure for purposes other than remedying health state, unspecified: Secondary | ICD-10-CM | POA: Diagnosis not present

## 2020-11-04 DIAGNOSIS — Z419 Encounter for procedure for purposes other than remedying health state, unspecified: Secondary | ICD-10-CM | POA: Diagnosis not present

## 2020-11-08 DIAGNOSIS — J0100 Acute maxillary sinusitis, unspecified: Secondary | ICD-10-CM | POA: Diagnosis not present

## 2020-11-08 DIAGNOSIS — R059 Cough, unspecified: Secondary | ICD-10-CM | POA: Diagnosis not present

## 2020-11-08 DIAGNOSIS — J020 Streptococcal pharyngitis: Secondary | ICD-10-CM | POA: Diagnosis not present

## 2020-11-25 DIAGNOSIS — L02611 Cutaneous abscess of right foot: Secondary | ICD-10-CM | POA: Diagnosis not present

## 2020-11-25 DIAGNOSIS — B49 Unspecified mycosis: Secondary | ICD-10-CM | POA: Diagnosis not present

## 2020-11-25 DIAGNOSIS — R21 Rash and other nonspecific skin eruption: Secondary | ICD-10-CM | POA: Diagnosis not present

## 2020-12-05 DIAGNOSIS — Z419 Encounter for procedure for purposes other than remedying health state, unspecified: Secondary | ICD-10-CM | POA: Diagnosis not present

## 2021-01-04 DIAGNOSIS — Z419 Encounter for procedure for purposes other than remedying health state, unspecified: Secondary | ICD-10-CM | POA: Diagnosis not present

## 2021-01-08 ENCOUNTER — Encounter: Payer: Self-pay | Admitting: Family Medicine

## 2021-01-08 ENCOUNTER — Other Ambulatory Visit: Payer: Self-pay

## 2021-01-08 ENCOUNTER — Ambulatory Visit (INDEPENDENT_AMBULATORY_CARE_PROVIDER_SITE_OTHER): Payer: Medicaid Other | Admitting: Family Medicine

## 2021-01-08 VITALS — BP 129/91 | HR 61 | Temp 98.0°F | Ht 71.0 in | Wt 302.6 lb

## 2021-01-08 DIAGNOSIS — L74511 Primary focal hyperhidrosis, face: Secondary | ICD-10-CM

## 2021-01-08 DIAGNOSIS — R202 Paresthesia of skin: Secondary | ICD-10-CM

## 2021-01-08 DIAGNOSIS — Z7689 Persons encountering health services in other specified circumstances: Secondary | ICD-10-CM

## 2021-01-08 DIAGNOSIS — R200 Anesthesia of skin: Secondary | ICD-10-CM

## 2021-01-08 DIAGNOSIS — H0263 Xanthelasma of right eye, unspecified eyelid: Secondary | ICD-10-CM

## 2021-01-08 DIAGNOSIS — H0266 Xanthelasma of left eye, unspecified eyelid: Secondary | ICD-10-CM

## 2021-01-08 DIAGNOSIS — E66813 Obesity, class 3: Secondary | ICD-10-CM

## 2021-01-08 DIAGNOSIS — L732 Hidradenitis suppurativa: Secondary | ICD-10-CM

## 2021-01-08 LAB — BAYER DCA HB A1C WAIVED: HB A1C (BAYER DCA - WAIVED): 4.8 % (ref <7.0)

## 2021-01-08 NOTE — Progress Notes (Signed)
New Patient Office Visit  Assessment & Plan:  1. Numbness and tingling of both legs - Anemia Profile B - CMP14+EGFR - TSH  2. Facial sweating - Anemia Profile B - CMP14+EGFR - TSH  3. Obesity, Class III, BMI 40-49.9 (morbid obesity) (HCC) - CMP14+EGFR - Lipid panel - TSH - Bayer DCA Hb A1c Waived  4. Xanthelasma of eyelid, bilateral - Lipid panel  5. Hydradenitis - Bayer DCA Hb A1c Waived  6. Encounter to establish care   Follow-up: Return as directed after labs result.   Hendricks Limes, MSN, APRN, FNP-C [facility]  Subjective:  Patient ID: Peggy Rodriguez, female    DOB: 03-16-1986  Age: 35 y.o. MRN: [id number]  Patient Care Team: Loman Brooklyn, FNP as PCP - General (Family Medicine)  CC:  Chief Complaint  Patient presents with   New Patient (Initial Visit)    Health Dept    Establish Care   Leg Pain    Bilateral lower leg pain and numbness x 1 year on and off     Excessive Sweating    Face x 6 months     HPI Peggy Rodriguez presents to establish care.   Patient reports bilateral lower leg pain and numbness on and off for the past year.  She describes the pain as tingling.  States she often has to wear shoes to get up and walk because her feet hurt so bad.  Patient would like to be tested for diabetes due to a family history and skin issues around her nails.  States that she develops calluses really easily and has been told by multiple people that this is a symptom of diabetes.  She does urinate a lot, but also drinks a lot of water.  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  Also concerned about facial sweating for at least the past 6 months.  States she does not sweat anywhere else.  Will even be sweating when the house temperature is in the 60s or in the wintertime.   Review of Systems  Constitutional:  Positive for diaphoresis. Negative for chills, fever, malaise/fatigue and weight loss.  HENT:  Negative for congestion, ear  discharge, ear pain, nosebleeds, sinus pain, sore throat and tinnitus.   Eyes:  Negative for blurred vision, double vision, pain, discharge and redness.  Respiratory:  Negative for cough, shortness of breath and wheezing.   Cardiovascular:  Negative for chest pain, palpitations and leg swelling.  Gastrointestinal:  Negative for abdominal pain, constipation, diarrhea, heartburn, nausea and vomiting.  Genitourinary:  Negative for dysuria, frequency and urgency.  Musculoskeletal:  Negative for myalgias.  Skin:  Negative for rash.  Neurological:  Negative for dizziness, seizures, weakness and headaches.  Psychiatric/Behavioral:  Negative for depression, substance abuse and suicidal ideas. The patient is not nervous/anxious.    Current Outpatient Medications:    etonogestrel (NEXPLANON) 38 MG IMPL implant, 1 each by Subdermal route once., Disp: , Rfl:   No Known Allergies  Past Medical History:  Diagnosis Date   Colon perforation ([place])    Diverticulitis     Past Surgical History:  Procedure Laterality Date   CHOLECYSTECTOMY     KNEE SURGERY      Family History  Problem Relation Age of Onset   Diabetes Mother    Hypertension Mother    Coronary artery disease Mother    Thyroid disease Mother    Lymphoma Sister    Immunodeficiency Sister    Hypertension  Maternal Grandmother    Thyroid cancer Maternal Grandmother    Kidney cancer Maternal Grandmother    Stomach cancer Maternal Grandmother    Diabetes Maternal Grandmother    Thyroid cancer Maternal Grandfather     Social History   Socioeconomic History   Marital status: Single    Spouse name: Not on file   Number of children: Not on file   Years of education: Not on file   Highest education level: Not on file  Occupational History   Not on file  Tobacco Use   Smoking status: Some Days    Packs/day: 0.25    Pack years: 0.00    Types: Cigarettes   Smokeless tobacco: Never  Vaping Use   Vaping Use: Never used   Substance and Sexual Activity   Alcohol use: Yes    Comment: occ   Drug use: Not Currently    Types: Marijuana    Comment: patient states she quit x 4 months ago   Sexual activity: Yes    Birth control/protection: Condom, Implant  Other Topics Concern   Not on file  Social History Narrative   Not on file   Social Determinants of Health   Financial Resource Strain: Not on file  Food Insecurity: Not on file  Transportation Needs: Not on file  Physical Activity: Not on file  Stress: Not on file  Social Connections: Not on file  Intimate Partner Violence: Not on file    Objective:   Today's Vitals: BP (!) 129/91   Pulse 61   Temp 98 F (36.7 C) (Temporal)   Ht _0  (1.803 m)   Wt (!) 302 lb 9.6 oz (137.3 kg)   SpO2 98%   BMI 42.20 kg/m   Physical Exam Vitals reviewed.  Constitutional:      General: She is not in acute distress.    Appearance: Normal appearance. She is morbidly obese. She is not ill-appearing, toxic-appearing or diaphoretic.  HENT:     Head: Normocephalic and atraumatic.  Eyes:     General: No scleral icterus.       Right eye: No discharge.        Left eye: No discharge.     Conjunctiva/sclera: Conjunctivae normal.     Comments: Xanthelasmas bilaterally.  Cardiovascular:     Rate and Rhythm: Normal rate and regular rhythm.     Heart sounds: Normal heart sounds. No murmur heard.   No friction rub. No gallop.  Pulmonary:     Effort: Pulmonary effort is normal. No respiratory distress.     Breath sounds: Normal breath sounds. No stridor. No wheezing, rhonchi or rales.  Musculoskeletal:        General: Normal range of motion.     Cervical back: Normal range of motion.  Skin:    General: Skin is warm and dry.     Capillary Refill: Capillary refill takes less than 2 seconds.  Neurological:     General: No focal deficit present.     Mental Status: She is alert and oriented to person, place, and time. Mental status is at baseline.  Psychiatric:         Mood and Affect: Mood normal.        Behavior: Behavior normal.        Thought Content: Thought content normal.        Judgment: Judgment normal.

## 2021-01-09 ENCOUNTER — Encounter: Payer: Self-pay | Admitting: Family Medicine

## 2021-01-09 DIAGNOSIS — E538 Deficiency of other specified B group vitamins: Secondary | ICD-10-CM

## 2021-01-09 HISTORY — DX: Deficiency of other specified B group vitamins: E53.8

## 2021-01-09 LAB — LIPID PANEL
Chol/HDL Ratio: 2.9 ratio (ref 0.0–4.4)
Cholesterol, Total: 162 mg/dL (ref 100–199)
HDL: 55 mg/dL (ref >39)
LDL Chol Calc (NIH): 96 mg/dL (ref 0–99)
Triglycerides: 56 mg/dL (ref 0–149)
VLDL Cholesterol Cal: 11 mg/dL (ref 5–40)

## 2021-01-09 LAB — ANEMIA PROFILE B
Basophils Absolute: 0 10*3/uL (ref 0.0–0.2)
Basos: 0 %
EOS (ABSOLUTE): 0.4 10*3/uL (ref 0.0–0.4)
Eos: 6 %
Ferritin: 98 ng/mL (ref 15–150)
Folate: 2.3 ng/mL — ABNORMAL LOW (ref >3.0)
Hematocrit: 39.5 % (ref 34.0–46.6)
Hemoglobin: 12.5 g/dL (ref 11.1–15.9)
Immature Grans (Abs): 0 10*3/uL (ref 0.0–0.1)
Immature Granulocytes: 0 %
Iron Saturation: 40 % (ref 15–55)
Iron: 100 ug/dL (ref 27–159)
Lymphocytes Absolute: 3.2 10*3/uL — ABNORMAL HIGH (ref 0.7–3.1)
Lymphs: 42 %
MCH: 26.9 pg (ref 26.6–33.0)
MCHC: 31.6 g/dL (ref 31.5–35.7)
MCV: 85 fL (ref 79–97)
Monocytes Absolute: 0.5 10*3/uL (ref 0.1–0.9)
Monocytes: 7 %
Neutrophils Absolute: 3.4 10*3/uL (ref 1.4–7.0)
Neutrophils: 45 %
Platelets: 339 10*3/uL (ref 150–450)
RBC: 4.65 x10E6/uL (ref 3.77–5.28)
RDW: 13.7 % (ref 11.7–15.4)
Retic Ct Pct: 1.7 % (ref 0.6–2.6)
Total Iron Binding Capacity: 248 ug/dL — ABNORMAL LOW (ref 250–450)
UIBC: 148 ug/dL (ref 131–425)
Vitamin B-12: 415 pg/mL (ref 232–1245)
WBC: 7.6 10*3/uL (ref 3.4–10.8)

## 2021-01-09 LAB — CMP14+EGFR
ALT: 13 IU/L (ref 0–32)
AST: 13 IU/L (ref 0–40)
Albumin/Globulin Ratio: 1.6 (ref 1.2–2.2)
Albumin: 4.2 g/dL (ref 3.8–4.8)
Alkaline Phosphatase: 76 IU/L (ref 44–121)
BUN/Creatinine Ratio: 11 (ref 9–23)
BUN: 8 mg/dL (ref 6–20)
Bilirubin Total: 0.4 mg/dL (ref 0.0–1.2)
CO2: 24 mmol/L (ref 20–29)
Calcium: 9.1 mg/dL (ref 8.7–10.2)
Chloride: 101 mmol/L (ref 96–106)
Creatinine, Ser: 0.7 mg/dL (ref 0.57–1.00)
Globulin, Total: 2.6 g/dL (ref 1.5–4.5)
Glucose: 87 mg/dL (ref 65–99)
Potassium: 4.4 mmol/L (ref 3.5–5.2)
Sodium: 138 mmol/L (ref 134–144)
Total Protein: 6.8 g/dL (ref 6.0–8.5)
eGFR: 116 mL/min/{1.73_m2} (ref >59)

## 2021-01-09 LAB — TSH: TSH: 1.05 u[IU]/mL (ref 0.450–4.500)

## 2021-01-13 ENCOUNTER — Encounter: Payer: Self-pay | Admitting: Family Medicine

## 2021-02-04 DIAGNOSIS — Z419 Encounter for procedure for purposes other than remedying health state, unspecified: Secondary | ICD-10-CM | POA: Diagnosis not present

## 2021-03-07 DIAGNOSIS — Z419 Encounter for procedure for purposes other than remedying health state, unspecified: Secondary | ICD-10-CM | POA: Diagnosis not present

## 2021-04-05 IMAGING — CT CT ABD-PELV W/ CM
2 of 4 series · 17 of 46 positions shown, 19 images · IV contrast (omnipaque)
Comparison: None.

CLINICAL DATA: Lower abdominal pain with swelling and constipation.
Nausea and vomiting for 3 days.

EXAM:
CT ABDOMEN AND PELVIS WITH CONTRAST
TECHNIQUE: Multidetector CT imaging of the abdomen and pelvis was performed
using the standard protocol following bolus administration of
intravenous contrast.
CONTRAST:  100mL OMNIPAQUE IOHEXOL 300 MG/ML  SOLN

[Series 2: axial st · axial · 0.98mm/px · z∈[+782,+1247]mm · 14 of 103 slices shown, 16 images]
[im 5/103  soft-tissue]
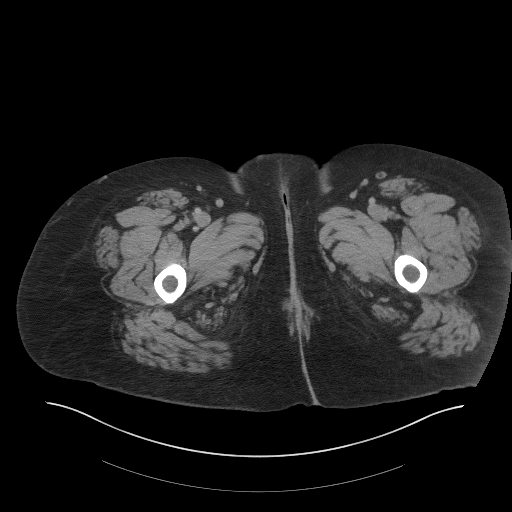
[im 5/103  bone]
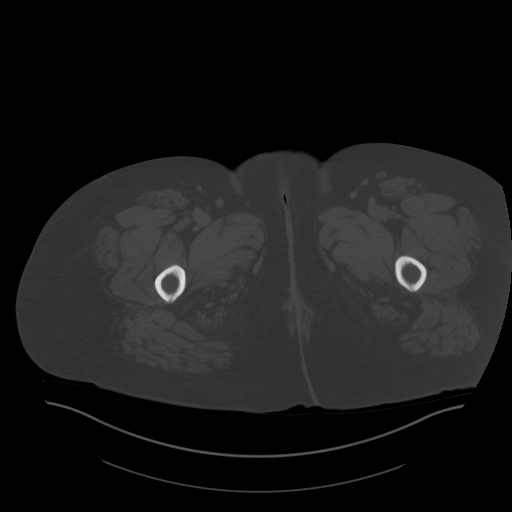
[im 13/103  soft-tissue]
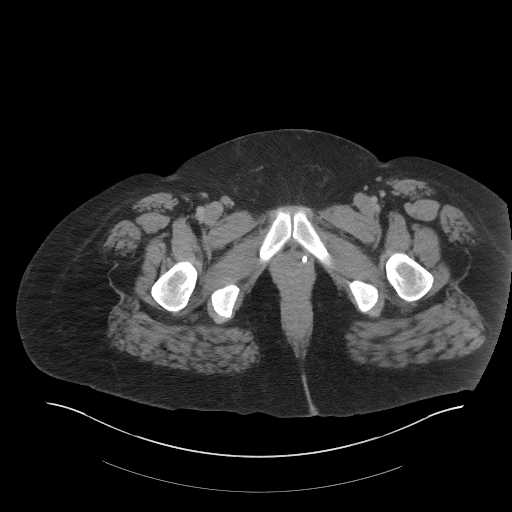
[im 21/103  soft-tissue]
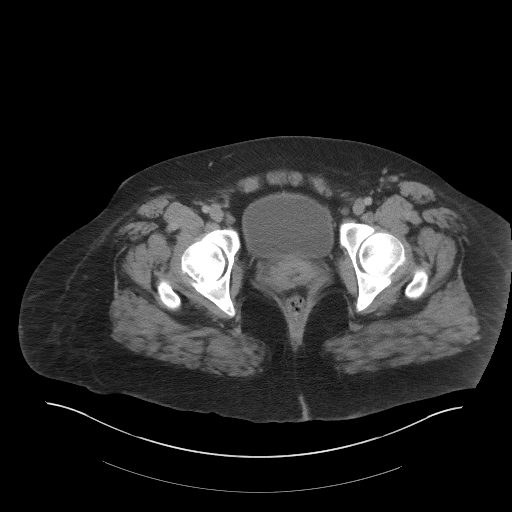
[im 29/103  soft-tissue]
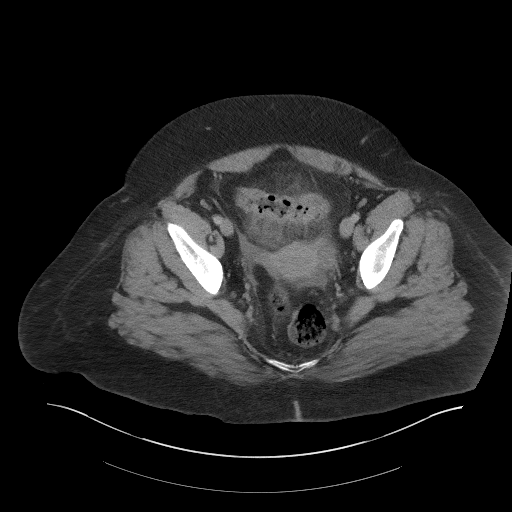
[im 33/103  soft-tissue]
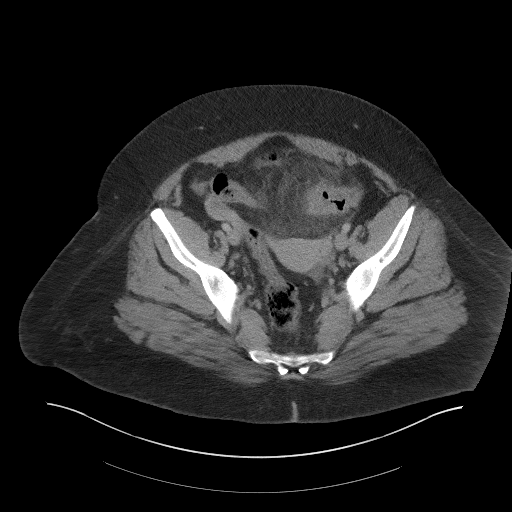
[im 41/103  soft-tissue]
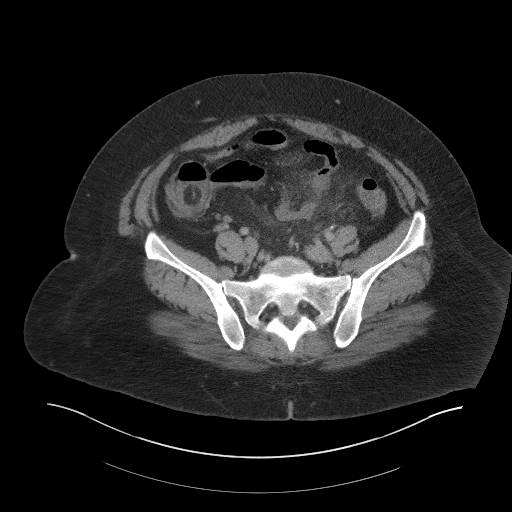
[im 49/103  soft-tissue]
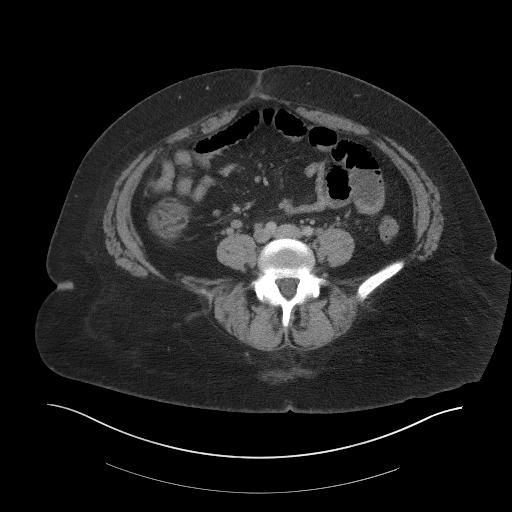
[im 54/103  soft-tissue]
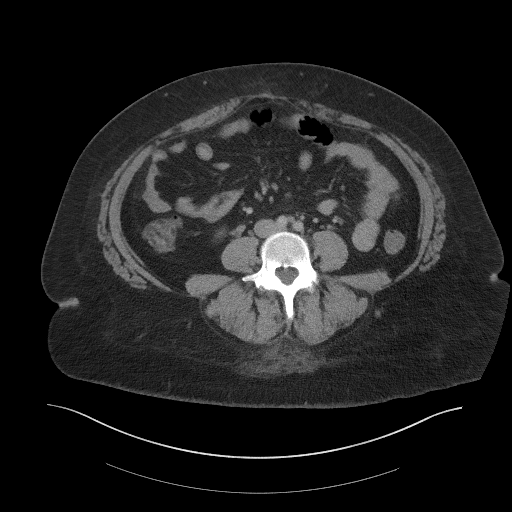
[im 62/103  soft-tissue]
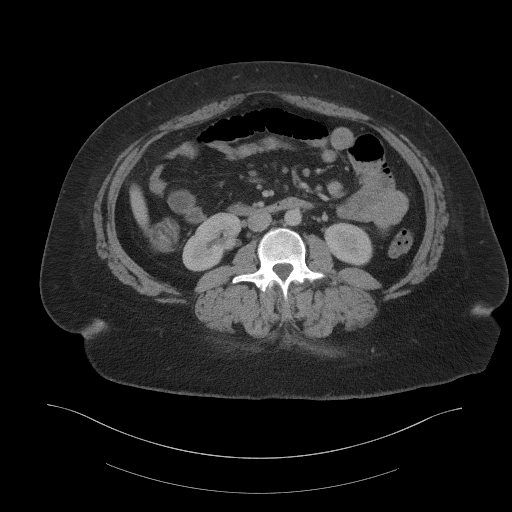
[im 62/103  bone]
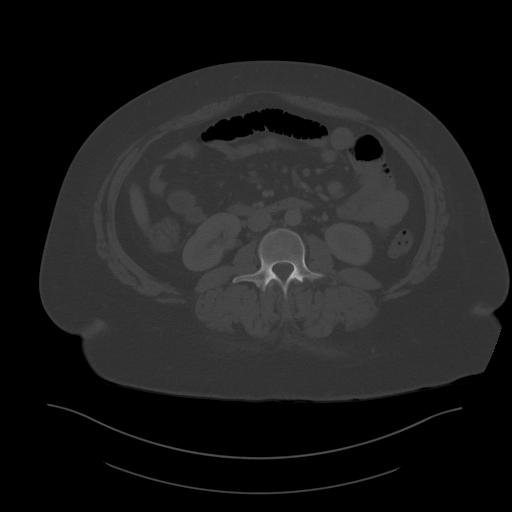
[im 70/103  soft-tissue]
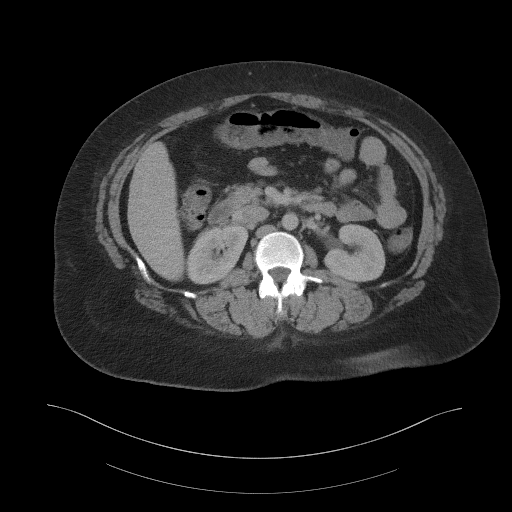
[im 78/103  soft-tissue]
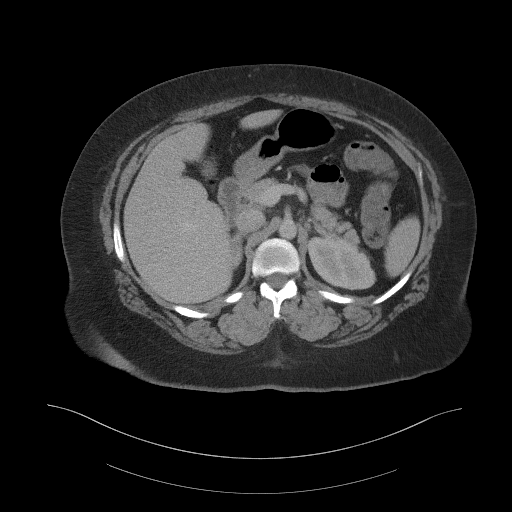
[im 82/103  soft-tissue]
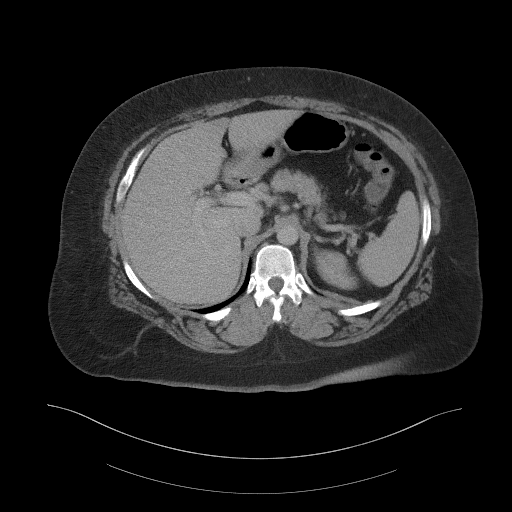
[im 90/103  soft-tissue]
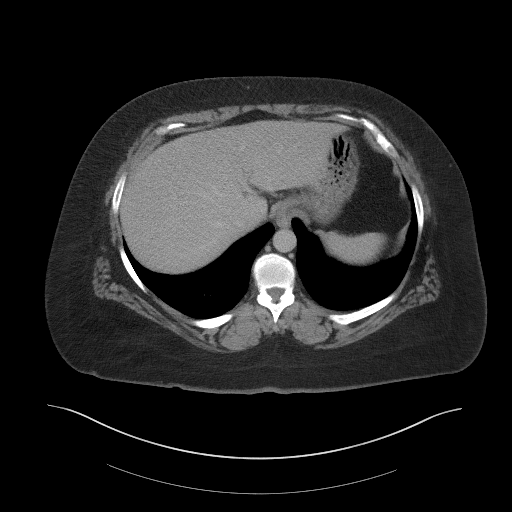
[im 98/103  soft-tissue]
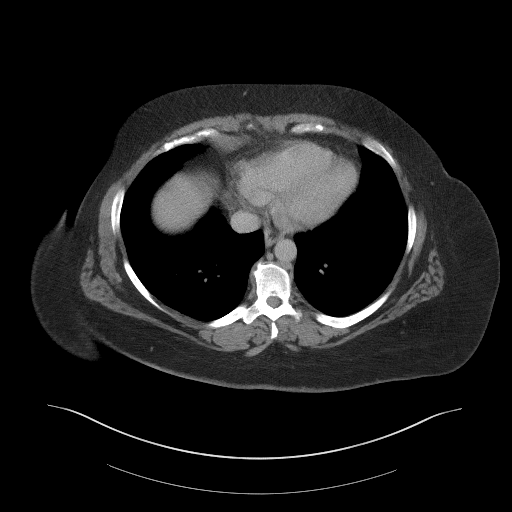

[Series 5: coronal st · coronal · 0.94mm/px · 3 of 128 slices shown]
[im 43/128  soft-tissue]
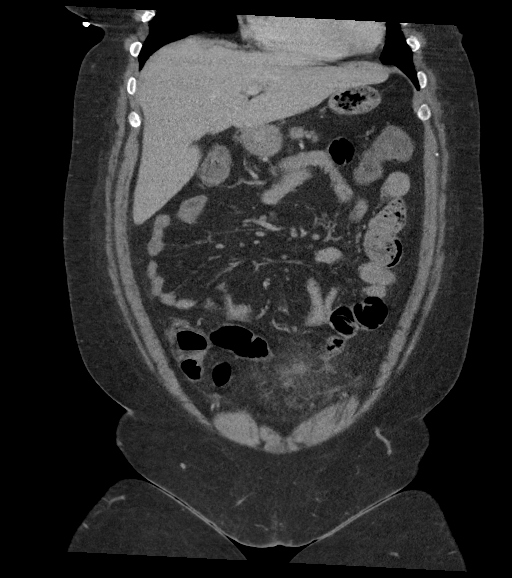
[im 57/128  soft-tissue]
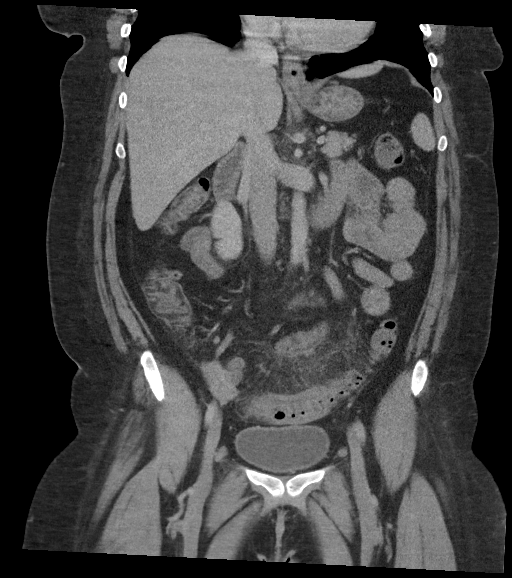
[im 71/128  soft-tissue]
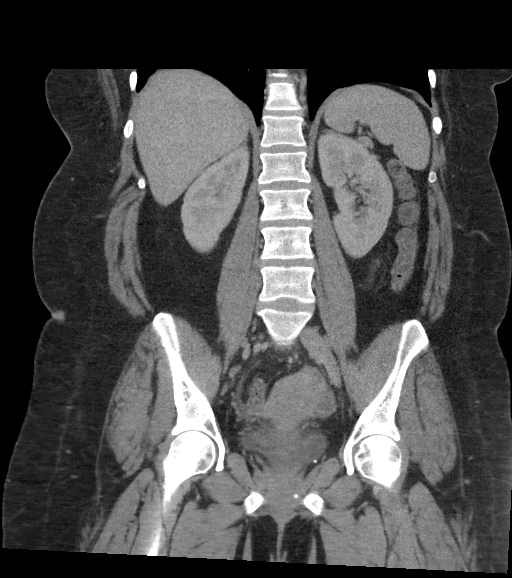

[17 of 46 positions shown; findings below may reference images not displayed]

FINDINGS: Lower chest:  No contributory findings.

Hepatobiliary: No focal liver abnormality.Cholecystectomy.

Pancreas: Unremarkable.

Spleen: Unremarkable.

Adrenals/Urinary Tract: Negative adrenals. No hydronephrosis or
stone. Unremarkable bladder.

Stomach/Bowel: Left colonic diverticulosis with inflammation around
a sigmoid diverticulum, complicated by extraluminal gas with a 17 mm
span. No obstruction. No appendicitis.

Vascular/Lymphatic: No acute vascular abnormality. No mass or
adenopathy.

Reproductive:No pathologic findings.

Other: No ascites or pneumoperitoneum.

Musculoskeletal: No acute abnormalities. Lower lumbar facet
degeneration.
IMPRESSION: Sigmoid diverticulitis complicated by contained perforation (local
mesenteric gas without fluid collection).

## 2021-04-06 DIAGNOSIS — Z419 Encounter for procedure for purposes other than remedying health state, unspecified: Secondary | ICD-10-CM | POA: Diagnosis not present

## 2021-04-24 DIAGNOSIS — R519 Headache, unspecified: Secondary | ICD-10-CM | POA: Diagnosis not present

## 2021-04-24 DIAGNOSIS — J029 Acute pharyngitis, unspecified: Secondary | ICD-10-CM | POA: Diagnosis not present

## 2021-04-24 DIAGNOSIS — J329 Chronic sinusitis, unspecified: Secondary | ICD-10-CM | POA: Diagnosis not present

## 2021-05-07 DIAGNOSIS — Z419 Encounter for procedure for purposes other than remedying health state, unspecified: Secondary | ICD-10-CM | POA: Diagnosis not present

## 2021-06-06 DIAGNOSIS — Z419 Encounter for procedure for purposes other than remedying health state, unspecified: Secondary | ICD-10-CM | POA: Diagnosis not present

## 2021-07-07 DIAGNOSIS — Z419 Encounter for procedure for purposes other than remedying health state, unspecified: Secondary | ICD-10-CM | POA: Diagnosis not present

## 2021-07-08 ENCOUNTER — Encounter: Payer: Self-pay | Admitting: Family Medicine

## 2021-08-07 DIAGNOSIS — Z419 Encounter for procedure for purposes other than remedying health state, unspecified: Secondary | ICD-10-CM | POA: Diagnosis not present

## 2021-09-04 DIAGNOSIS — Z419 Encounter for procedure for purposes other than remedying health state, unspecified: Secondary | ICD-10-CM | POA: Diagnosis not present

## 2021-10-01 DIAGNOSIS — S52611A Displaced fracture of right ulna styloid process, initial encounter for closed fracture: Secondary | ICD-10-CM | POA: Diagnosis not present

## 2021-10-01 DIAGNOSIS — S8001XA Contusion of right knee, initial encounter: Secondary | ICD-10-CM | POA: Diagnosis not present

## 2021-10-01 DIAGNOSIS — E669 Obesity, unspecified: Secondary | ICD-10-CM | POA: Diagnosis not present

## 2021-10-01 DIAGNOSIS — M47813 Spondylosis without myelopathy or radiculopathy, cervicothoracic region: Secondary | ICD-10-CM | POA: Diagnosis not present

## 2021-10-01 DIAGNOSIS — M50322 Other cervical disc degeneration at C5-C6 level: Secondary | ICD-10-CM | POA: Diagnosis not present

## 2021-10-01 DIAGNOSIS — S62101A Fracture of unspecified carpal bone, right wrist, initial encounter for closed fracture: Secondary | ICD-10-CM | POA: Diagnosis not present

## 2021-10-01 DIAGNOSIS — S50811A Abrasion of right forearm, initial encounter: Secondary | ICD-10-CM | POA: Diagnosis not present

## 2021-10-01 DIAGNOSIS — M25561 Pain in right knee: Secondary | ICD-10-CM | POA: Diagnosis not present

## 2021-10-01 DIAGNOSIS — S52301A Unspecified fracture of shaft of right radius, initial encounter for closed fracture: Secondary | ICD-10-CM | POA: Diagnosis not present

## 2021-10-01 DIAGNOSIS — M1711 Unilateral primary osteoarthritis, right knee: Secondary | ICD-10-CM | POA: Diagnosis not present

## 2021-10-01 DIAGNOSIS — S50812A Abrasion of left forearm, initial encounter: Secondary | ICD-10-CM | POA: Diagnosis not present

## 2021-10-01 DIAGNOSIS — S161XXA Strain of muscle, fascia and tendon at neck level, initial encounter: Secondary | ICD-10-CM | POA: Diagnosis not present

## 2021-10-01 DIAGNOSIS — F17210 Nicotine dependence, cigarettes, uncomplicated: Secondary | ICD-10-CM | POA: Diagnosis not present

## 2021-10-01 DIAGNOSIS — M25461 Effusion, right knee: Secondary | ICD-10-CM | POA: Diagnosis not present

## 2021-10-01 DIAGNOSIS — M7989 Other specified soft tissue disorders: Secondary | ICD-10-CM | POA: Diagnosis not present

## 2021-10-02 ENCOUNTER — Telehealth: Payer: Self-pay

## 2021-10-02 NOTE — Telephone Encounter (Signed)
Transition Care Management Unsuccessful Follow-up Telephone Call ? ?Date of discharge and from where:  10/01/2021 from [facility] ? ?Attempts:  1st Attempt ? ?Reason for unsuccessful TCM follow-up call:  Unable to leave message ? ? ? ?

## 2021-10-03 NOTE — Telephone Encounter (Signed)
Transition Care Management Unsuccessful Follow-up Telephone Call ? ?Date of discharge and from where:  10/01/2021 from [facility] ? ?Attempts:  2nd Attempt ? ?Reason for unsuccessful TCM follow-up call:  Unable to leave message ? ? ? ?

## 2021-10-04 NOTE — Telephone Encounter (Signed)
Transition Care Management Unsuccessful Follow-up Telephone Call ? ?Date of discharge and from where:  10/01/2021-[facility] ? ?Attempts:  3rd Attempt ? ?Reason for unsuccessful TCM follow-up call:  Unable to leave message ? ?  ?

## 2021-10-05 DIAGNOSIS — Z419 Encounter for procedure for purposes other than remedying health state, unspecified: Secondary | ICD-10-CM | POA: Diagnosis not present

## 2021-10-07 DIAGNOSIS — S52571A Other intraarticular fracture of lower end of right radius, initial encounter for closed fracture: Secondary | ICD-10-CM | POA: Diagnosis not present

## 2021-10-11 DIAGNOSIS — M25531 Pain in right wrist: Secondary | ICD-10-CM | POA: Diagnosis not present

## 2021-10-11 DIAGNOSIS — M2351 Chronic instability of knee, right knee: Secondary | ICD-10-CM | POA: Diagnosis not present

## 2021-10-11 DIAGNOSIS — S52611A Displaced fracture of right ulna styloid process, initial encounter for closed fracture: Secondary | ICD-10-CM | POA: Diagnosis not present

## 2021-10-11 DIAGNOSIS — M25431 Effusion, right wrist: Secondary | ICD-10-CM | POA: Diagnosis not present

## 2021-10-11 DIAGNOSIS — S52571A Other intraarticular fracture of lower end of right radius, initial encounter for closed fracture: Secondary | ICD-10-CM | POA: Diagnosis not present

## 2021-10-11 DIAGNOSIS — M25461 Effusion, right knee: Secondary | ICD-10-CM | POA: Diagnosis not present

## 2021-10-11 DIAGNOSIS — S8991XA Unspecified injury of right lower leg, initial encounter: Secondary | ICD-10-CM | POA: Diagnosis not present

## 2021-10-11 DIAGNOSIS — M7989 Other specified soft tissue disorders: Secondary | ICD-10-CM | POA: Diagnosis not present

## 2021-10-11 DIAGNOSIS — M25361 Other instability, right knee: Secondary | ICD-10-CM | POA: Diagnosis not present

## 2021-10-14 DIAGNOSIS — M25431 Effusion, right wrist: Secondary | ICD-10-CM | POA: Diagnosis not present

## 2021-10-14 DIAGNOSIS — S52571A Other intraarticular fracture of lower end of right radius, initial encounter for closed fracture: Secondary | ICD-10-CM | POA: Diagnosis not present

## 2021-10-14 DIAGNOSIS — M25531 Pain in right wrist: Secondary | ICD-10-CM | POA: Diagnosis not present

## 2021-10-16 DIAGNOSIS — S52571D Other intraarticular fracture of lower end of right radius, subsequent encounter for closed fracture with routine healing: Secondary | ICD-10-CM | POA: Diagnosis not present

## 2021-10-16 DIAGNOSIS — S52571A Other intraarticular fracture of lower end of right radius, initial encounter for closed fracture: Secondary | ICD-10-CM | POA: Diagnosis not present

## 2021-10-16 DIAGNOSIS — M25531 Pain in right wrist: Secondary | ICD-10-CM | POA: Diagnosis not present

## 2021-10-16 DIAGNOSIS — M25431 Effusion, right wrist: Secondary | ICD-10-CM | POA: Diagnosis not present

## 2021-10-16 DIAGNOSIS — S52611D Displaced fracture of right ulna styloid process, subsequent encounter for closed fracture with routine healing: Secondary | ICD-10-CM | POA: Diagnosis not present

## 2021-10-16 DIAGNOSIS — S52611A Displaced fracture of right ulna styloid process, initial encounter for closed fracture: Secondary | ICD-10-CM | POA: Diagnosis not present

## 2021-11-04 DIAGNOSIS — Z419 Encounter for procedure for purposes other than remedying health state, unspecified: Secondary | ICD-10-CM | POA: Diagnosis not present

## 2021-11-12 DIAGNOSIS — M89231 Other disorders of bone development and growth, right ulna: Secondary | ICD-10-CM | POA: Diagnosis not present

## 2021-11-12 DIAGNOSIS — S52571A Other intraarticular fracture of lower end of right radius, initial encounter for closed fracture: Secondary | ICD-10-CM | POA: Diagnosis not present

## 2021-11-12 DIAGNOSIS — S52571D Other intraarticular fracture of lower end of right radius, subsequent encounter for closed fracture with routine healing: Secondary | ICD-10-CM | POA: Diagnosis not present

## 2021-12-05 DIAGNOSIS — Z419 Encounter for procedure for purposes other than remedying health state, unspecified: Secondary | ICD-10-CM | POA: Diagnosis not present

## 2022-01-04 DIAGNOSIS — Z419 Encounter for procedure for purposes other than remedying health state, unspecified: Secondary | ICD-10-CM | POA: Diagnosis not present

## 2022-02-04 DIAGNOSIS — Z419 Encounter for procedure for purposes other than remedying health state, unspecified: Secondary | ICD-10-CM | POA: Diagnosis not present

## 2022-02-26 ENCOUNTER — Ambulatory Visit (INDEPENDENT_AMBULATORY_CARE_PROVIDER_SITE_OTHER): Payer: Medicaid Other

## 2022-02-26 DIAGNOSIS — Z111 Encounter for screening for respiratory tuberculosis: Secondary | ICD-10-CM | POA: Diagnosis not present

## 2022-02-26 NOTE — Progress Notes (Signed)
TB skin test placed on patients left arm and tolerated well.

## 2022-02-28 ENCOUNTER — Ambulatory Visit (INDEPENDENT_AMBULATORY_CARE_PROVIDER_SITE_OTHER): Payer: Medicaid Other | Admitting: *Deleted

## 2022-02-28 LAB — TB SKIN TEST
Induration: 0 mm
TB Skin Test: NEGATIVE

## 2022-03-07 DIAGNOSIS — Z419 Encounter for procedure for purposes other than remedying health state, unspecified: Secondary | ICD-10-CM | POA: Diagnosis not present

## 2022-03-25 ENCOUNTER — Encounter: Payer: Medicaid Other | Admitting: Family Medicine

## 2022-03-26 ENCOUNTER — Encounter: Payer: Self-pay | Admitting: Nurse Practitioner

## 2022-03-26 ENCOUNTER — Ambulatory Visit (INDEPENDENT_AMBULATORY_CARE_PROVIDER_SITE_OTHER): Payer: Medicaid Other | Admitting: Nurse Practitioner

## 2022-03-26 VITALS — BP 128/79 | HR 88 | Temp 98.6°F | Ht 69.0 in | Wt 329.0 lb

## 2022-03-26 DIAGNOSIS — G4730 Sleep apnea, unspecified: Secondary | ICD-10-CM

## 2022-03-26 NOTE — Progress Notes (Unsigned)
Acute Office Visit  Subjective:     Patient ID: Peggy Rodriguez, female    DOB: [birth year], 36 y.o.   MRN: [id number]  Chief Complaint  Patient presents with   Annual Exam   Insomnia    Years - wants a sleep study referral     HPI Patient is in today for Sleep Apnea: Patient presents with possible obstructive sleep apnea. Patent has a a few years history of symptoms of daytime fatigue. Patient generally gets 6 or 7 hours of sleep per night, and states they generally have generally restful sleep. Snoring of moderate severity is present. Apneic episodes is present. Nasal obstruction is not present.  Patient has not had tonsillectomy.    Obesity: Patient complains of obesity. Patient cites health, increased physical ability, self-image as reasons for wanting to lose weight.  Obesity History Weight in late teens: 240 lb. Period of greatest weight gain: 360 lb during adult years Lowest adult weight: 240 Highest adult weight: 360 Amount of time at present weight: Few years lb.   History of Weight Loss Efforts Greatest amount of weight lost: 120 lb over several months Amount of time that loss was maintained: Few months Circumstances associated with regain of weight: High-calorie diet Successful weight loss techniques attempted: self-directed dieting Unsuccessful weight loss techniques attempted: self-directed dieting and very low calorie diet  Current Exercise Habits none  Current Eating Habits Number of regular meals per day: a few Number of snacking episodes per day: a few Who shops for food? patient and children Who prepares food? patient Who eats with patient? patient and family Binge behavior?: no Purge behavior? no Anorexic behavior? no Eating precipitated by stress? no Guilt feelings associated with eating? no  Other Potential Contributing Factors Use of alcohol: average really drinks Use of medications that may cause weight gain  Nexplanon History of past abuse?  none Psych History:  None Comorbidities: sleep apnea/hypopnea       Review of Systems  Constitutional: Negative.   HENT: Negative.    Eyes: Negative.   Cardiovascular: Negative.   Gastrointestinal: Negative.   Genitourinary: Negative.  Negative for dysuria.  Musculoskeletal: Negative.   Skin: Negative.   Psychiatric/Behavioral: Negative.    All other systems reviewed and are negative.       Objective:    BP 128/79   Pulse 88   Temp 98.6 F (37 C)   Ht 5' 9"  (1.753 m)   Wt (!) 329 lb (149.2 kg)   LMP 03/26/2022 (Exact Date) Comment: curently on 3 weeks  SpO2 98%   BMI 48.58 kg/m  BP Readings from Last 3 Encounters:  03/26/22 128/79  01/08/21 (!) 129/91  07/16/20 (!) 143/95   Wt Readings from Last 3 Encounters:  03/26/22 (!) 329 lb (149.2 kg)  01/08/21 (!) 302 lb 9.6 oz (137.3 kg)  07/16/20 (!) 307 lb (139.3 kg)      Physical Exam Vitals and nursing note reviewed.  Constitutional:      Appearance: Normal appearance. She is obese.  HENT:     Head: Normocephalic.     Right Ear: External ear normal.     Left Ear: External ear normal.     Nose: Nose normal.     Mouth/Throat:     Mouth: Mucous membranes are moist.     Pharynx: Oropharynx is clear.  Eyes:     Conjunctiva/sclera: Conjunctivae normal.  Cardiovascular:     Rate and Rhythm: Normal rate and regular rhythm.  Pulses: Normal pulses.     Heart sounds: Normal heart sounds.  Pulmonary:     Effort: Pulmonary effort is normal.     Breath sounds: Normal breath sounds.  Abdominal:     General: Bowel sounds are normal.  Skin:    General: Skin is warm.  Neurological:     General: No focal deficit present.     Mental Status: She is alert and oriented to person, place, and time.  Psychiatric:        Mood and Affect: Mood normal.        Behavior: Behavior normal.     No results found for any visits on 03/26/22.      Assessment & Plan:   Problem List Items Addressed This Visit        Respiratory   Sleep apnea - Primary    Sleep apnea completed.        Other   Obesity, Class III, BMI 40-49.9 (morbid obesity) ([place]) (Chronic)    Education provided to patient on weight loss, printed handouts given.  Referral completed to healthy weight loss and management clinic.  Patient will follow-up in 3 months.      Relevant Orders   Ambulatory referral to Sleep Studies   CBC with Differential/Platelet   CMP14+EGFR   Lipid panel   TSH   Amb Ref to Medical Weight Management    No orders of the defined types were placed in this encounter.   Return if symptoms worsen or fail to improve.  Ivy Lynn, NP

## 2022-03-26 NOTE — Patient Instructions (Signed)
Living With Sleep Apnea Sleep apnea is a condition in which breathing pauses or becomes shallow during sleep. Sleep apnea is most commonly caused by a collapsed or blocked airway. People with sleep apnea usually snore loudly. They may have times when they gasp and stop breathing for 10 seconds or more during sleep. This may happen many times during the night. The breaks in breathing also interrupt the deep sleep that you need to feel rested. Even if you do not completely wake up from the gaps in breathing, your sleep may not be restful and you feel tired during the day. You may also have a headache in the morning and low energy during the day, and you may feel anxious or depressed. How can sleep apnea affect me? Sleep apnea increases your chances of extreme tiredness during the day (daytime fatigue). It can also increase your risk for health conditions, such as: Heart attack. Stroke. Obesity. Type 2 diabetes. Heart failure. Irregular heartbeat. High blood pressure. If you have daytime fatigue as a result of sleep apnea, you may be more likely to: Perform poorly at school or work. Fall asleep while driving. Have difficulty with attention. Develop depression or anxiety. Have sexual dysfunction. What actions can I take to manage sleep apnea? Sleep apnea treatment  If you were given a device to open your airway while you sleep, use it only as told by your health care provider. You may be given: An oral appliance. This is a custom-made mouthpiece that shifts your lower jaw forward. A continuous positive airway pressure (CPAP) device. This device blows air through a mask when you breathe out (exhale). A nasal expiratory positive airway pressure (EPAP) device. This device has valves that you put into each nostril. A bi-level positive airway pressure (BIPAP) device. This device blows air through a mask when you breathe in (inhale) and breathe out (exhale). You may need surgery if other treatments  do not work for you. Sleep habits Go to sleep and wake up at the same time every day. This helps set your internal clock (circadian rhythm) for sleeping. If you stay up later than usual, such as on weekends, try to get up in the morning within 2 hours of your normal wake time. Try to get at least 7-9 hours of sleep each night. Stop using a computer, tablet, and mobile phone a few hours before bedtime. Do not take long naps during the day. If you nap, limit it to 30 minutes. Have a relaxing bedtime routine. Reading or listening to music may relax you and help you sleep. Use your bedroom only for sleep. Keep your television and computer out of your bedroom. Keep your bedroom cool, dark, and quiet. Use a supportive mattress and pillows. Follow your health care provider's instructions for other changes to sleep habits. Nutrition Do not eat heavy meals in the evening. Do not have caffeine in the later part of the day. The effects of caffeine can last for more than 5 hours. Follow your health care provider's or dietitian's instructions for any diet changes. Lifestyle     Do not drink alcohol before bedtime. Alcohol can cause you to fall asleep at first, but then it can cause you to wake up in the middle of the night and have trouble getting back to sleep. Do not use any products that contain nicotine or tobacco. These products include cigarettes, chewing tobacco, and vaping devices, such as e-cigarettes. If you need help quitting, ask your health care provider. Medicines Take   over-the-counter and prescription medicines only as told by your health care provider. Do not use over-the-counter sleep medicine. You can become dependent on this medicine, and it can make sleep apnea worse. Do not use medicines, such as sedatives and narcotics, unless told by your health care provider. Activity Exercise on most days, but avoid exercising in the evening. Exercising near bedtime can interfere with  sleeping. If possible, spend time outside every day. Natural light helps regulate your circadian rhythm. General information Lose weight if you need to, and maintain a healthy weight. Keep all follow-up visits. This is important. If you are having surgery, make sure to tell your health care provider that you have sleep apnea. You may need to bring your device with you. Where to find more information Learn more about sleep apnea and daytime fatigue from: American Sleep Association: sleepassociation.org National Sleep Foundation: sleepfoundation.org National Heart, Lung, and Blood Institute: nhlbi.nih.gov Summary Sleep apnea is a condition in which breathing pauses or becomes shallow during sleep. Sleep apnea can cause daytime fatigue and other serious health conditions. You may need to wear a device while sleeping to help keep your airway open. If you are having surgery, make sure to tell your health care provider that you have sleep apnea. You may need to bring your device with you. Making changes to sleep habits, diet, lifestyle, and activity can help you manage sleep apnea. This information is not intended to replace advice given to you by your health care provider. Make sure you discuss any questions you have with your health care provider. Document Revised: 01/30/2021 Document Reviewed: 06/01/2020 Elsevier Patient Education  2023 Elsevier Inc.  

## 2022-03-27 ENCOUNTER — Other Ambulatory Visit: Payer: Medicaid Other

## 2022-03-27 DIAGNOSIS — G4733 Obstructive sleep apnea (adult) (pediatric): Secondary | ICD-10-CM | POA: Insufficient documentation

## 2022-03-27 DIAGNOSIS — G4730 Sleep apnea, unspecified: Secondary | ICD-10-CM | POA: Insufficient documentation

## 2022-03-27 NOTE — Assessment & Plan Note (Signed)
Sleep apnea completed.

## 2022-03-27 NOTE — Assessment & Plan Note (Signed)
Education provided to patient on weight loss, printed handouts given.  Referral completed to healthy weight loss and management clinic.  Patient will follow-up in 3 months.

## 2022-04-02 LAB — CMP14+EGFR
ALT: 15 IU/L (ref 0–32)
AST: 15 IU/L (ref 0–40)
Albumin/Globulin Ratio: 1.7 (ref 1.2–2.2)
Albumin: 4.5 g/dL (ref 3.9–4.9)
Alkaline Phosphatase: 82 IU/L (ref 44–121)
BUN/Creatinine Ratio: 12 (ref 9–23)
BUN: 9 mg/dL (ref 6–20)
Bilirubin Total: 0.3 mg/dL (ref 0.0–1.2)
CO2: 21 mmol/L (ref 20–29)
Calcium: 9.3 mg/dL (ref 8.7–10.2)
Chloride: 98 mmol/L (ref 96–106)
Creatinine, Ser: 0.75 mg/dL (ref 0.57–1.00)
Globulin, Total: 2.7 g/dL (ref 1.5–4.5)
Glucose: 91 mg/dL (ref 70–99)
Potassium: 4.5 mmol/L (ref 3.5–5.2)
Sodium: 136 mmol/L (ref 134–144)
Total Protein: 7.2 g/dL (ref 6.0–8.5)
eGFR: 106 mL/min/{1.73_m2} (ref >59)

## 2022-04-02 LAB — CBC WITH DIFFERENTIAL/PLATELET
Basophils Absolute: 0 10*3/uL (ref 0.0–0.2)
Basos: 0 %
EOS (ABSOLUTE): 0.8 10*3/uL — ABNORMAL HIGH (ref 0.0–0.4)
Eos: 10 %
Hematocrit: 40.5 % (ref 34.0–46.6)
Hemoglobin: 12.9 g/dL (ref 11.1–15.9)
Immature Grans (Abs): 0 10*3/uL (ref 0.0–0.1)
Immature Granulocytes: 0 %
Lymphocytes Absolute: 2.8 10*3/uL (ref 0.7–3.1)
Lymphs: 35 %
MCH: 26.8 pg (ref 26.6–33.0)
MCHC: 31.9 g/dL (ref 31.5–35.7)
MCV: 84 fL (ref 79–97)
Monocytes Absolute: 0.5 10*3/uL (ref 0.1–0.9)
Monocytes: 6 %
Neutrophils Absolute: 3.8 10*3/uL (ref 1.4–7.0)
Neutrophils: 49 %
Platelets: 347 10*3/uL (ref 150–450)
RBC: 4.82 x10E6/uL (ref 3.77–5.28)
RDW: 13.9 % (ref 11.7–15.4)
WBC: 7.9 10*3/uL (ref 3.4–10.8)

## 2022-04-02 LAB — LIPID PANEL
Chol/HDL Ratio: 2.6 ratio (ref 0.0–4.4)
Cholesterol, Total: 161 mg/dL (ref 100–199)
HDL: 61 mg/dL (ref >39)
LDL Chol Calc (NIH): 85 mg/dL (ref 0–99)
Triglycerides: 78 mg/dL (ref 0–149)
VLDL Cholesterol Cal: 15 mg/dL (ref 5–40)

## 2022-04-02 LAB — TSH: TSH: 2.18 u[IU]/mL (ref 0.450–4.500)

## 2022-04-03 DIAGNOSIS — Z01419 Encounter for gynecological examination (general) (routine) without abnormal findings: Secondary | ICD-10-CM | POA: Diagnosis not present

## 2022-04-06 DIAGNOSIS — Z419 Encounter for procedure for purposes other than remedying health state, unspecified: Secondary | ICD-10-CM | POA: Diagnosis not present

## 2022-04-15 DIAGNOSIS — Z3046 Encounter for surveillance of implantable subdermal contraceptive: Secondary | ICD-10-CM | POA: Diagnosis not present

## 2022-04-30 ENCOUNTER — Encounter (INDEPENDENT_AMBULATORY_CARE_PROVIDER_SITE_OTHER): Payer: Medicaid Other | Admitting: Family Medicine

## 2022-05-06 ENCOUNTER — Ambulatory Visit (INDEPENDENT_AMBULATORY_CARE_PROVIDER_SITE_OTHER): Payer: Medicaid Other | Admitting: Neurology

## 2022-05-06 ENCOUNTER — Encounter: Payer: Self-pay | Admitting: Neurology

## 2022-05-06 VITALS — BP 132/78 | HR 81 | Ht 71.0 in | Wt 334.8 lb

## 2022-05-06 DIAGNOSIS — R351 Nocturia: Secondary | ICD-10-CM

## 2022-05-06 DIAGNOSIS — Z820 Family history of epilepsy and other diseases of the nervous system: Secondary | ICD-10-CM | POA: Diagnosis not present

## 2022-05-06 DIAGNOSIS — G4719 Other hypersomnia: Secondary | ICD-10-CM | POA: Diagnosis not present

## 2022-05-06 DIAGNOSIS — G4733 Obstructive sleep apnea (adult) (pediatric): Secondary | ICD-10-CM | POA: Diagnosis not present

## 2022-05-06 NOTE — Patient Instructions (Signed)

## 2022-05-06 NOTE — Progress Notes (Signed)
Subjective:    Patient ID: Peggy Rodriguez is a 36 y.o. female.  HPI    Star Age, MD, PhD [facility] [address]O. [place], [place] [address]  Dear Peggy Rodriguez,   I saw your patient, Peggy Rodriguez, upon your kind request in my sleep clinic to for initial consultation of her sleep disorder, in particular, concern for underlying obstructive sleep apnea.  The patient is unaccompanied today.  As you know, Peggy Rodriguez is a 35 year old female with an underlying medical history of diverticulitis, allergies, and morbid obesity with a BMI of over 45, who reports snoring and excessive daytime somnolence.  I reviewed your office note from 03/26/2022.  Her Epworth sleepiness score is 17 out of 24, fatigue severity score is 30 out of 63. She reports a prior diagnosis of obstructive sleep apnea in 2007, study was done in [place], [place], she has not been on PAP therapy.  She admits that she did not pursue it at the time, as she recalls, she had moderate sleep apnea at the time.  She was able to lose quite a bit of weight a few years ago in the realm of 80 pounds but in the past couple of years she has gained about 30 pounds back.  She is single, she lives with her 62 year old daughter who has witnessed her apneas from time to time.  Patient denies any recurrent morning headaches but has nocturia about once per average night.  She has an uncle with sleep apnea, on mom's side.  She drinks caffeine in the form of hot tea, usually 1 cup/day and 1 can of soda per day.  She drinks alcohol very occasionally, maybe once a month, she quit smoking some 4 months ago.  She works as an Glass blower/designer for a [employer] and is also in school to get her MBA and a minor in healthcare administration.  They have a [place] pig in the household, no cat or dog.  She does not have a TV in her bedroom currently.  She has been suffering from allergies and switch from Zyrtec to Claritin  recently.  Bedtime is generally around 10 PM and rise time around 7:30 AM.  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    Her Past Surgical History Is Significant For: Past Surgical History:  Procedure Laterality Date   BARTHOLIN GLAND CYST EXCISION  05/2013   CHOLECYSTECTOMY  2013   KNEE SURGERY Right 1996   Cyst removal    Her Family History Is Significant For: Family History  Problem Relation Age of Onset   Diabetes Mother    Hypertension Mother    Coronary artery disease Mother    Thyroid disease Mother    Hearing loss Father    Other Sister        immune thrombocytopenic purpura   Lymphoma Sister    Immunodeficiency Sister    Hypertension Maternal Grandmother    Thyroid cancer Maternal Grandmother    Kidney cancer Maternal Grandmother    Stomach cancer Maternal Grandmother    Diabetes Maternal Grandmother    Breast cancer Maternal Grandmother    Thyroid cancer Maternal Grandfather     Her Social History Is Significant For: Social History   Socioeconomic History   Marital status: Single    Spouse name: Not on file   Number of  children: Not on file   Years of education: Not on file   Highest education level: Not on file  Occupational History   Not on file  Tobacco Use   Smoking status: Some Days    Packs/day: 0.15    Types: Cigarettes   Smokeless tobacco: Never  Vaping Use   Vaping Use: Never used  Substance and Sexual Activity   Alcohol use: Yes    Comment: occ 4-5 per month   Drug use: Not Currently    Types: Marijuana, Cocaine    Comment: patient states she quit x 4 months ago, cocaine 2018   Sexual activity: Yes    Birth control/protection: Condom, Implant  Other Topics Concern   Not on file  Social History Narrative   Caffiene, 2 cups daily.    Lives home with daughter, works at [employer].    Some  college.    Social Determinants of Health   Financial Resource Strain: Not on file  Food Insecurity: Not on file  Transportation Needs: Not on file  Physical Activity: Not on file  Stress: Not on file  Social Connections: Not on file    Her Allergies Are:  No Known Allergies:   Her Current Medications Are:  Outpatient Encounter Medications as of 05/06/2022  Medication Sig   etonogestrel (NEXPLANON) 68 MG IMPL implant 1 each by Subdermal route once.   No facility-administered encounter medications on file as of 05/06/2022.  :   Review of Systems:  Out of a complete 14 point review of systems, all are reviewed and negative with the exception of these symptoms as listed below:  Review of Systems  Neurological:         Daytime fatigue, snoring, obesity, previous dx OSA in 2007 [facility].   No cpap.     Objective:  Neurological Exam  Physical Exam Physical Examination:   Vitals:   05/06/22 1250  BP: 132/78  Pulse: 81    General Examination: The patient is a very pleasant 36 y.o. female in no acute distress. She appears well-developed and well-nourished and well groomed.   HEENT: Normocephalic, atraumatic, pupils are equal, round and reactive to light, extraocular tracking is good without limitation to gaze excursion or nystagmus noted. Hearing is grossly intact. Face is symmetric with normal facial animation. Speech is clear with no dysarthria noted. There is no hypophonia. There is no lip, neck/head, jaw or voice tremor. Neck is supple with full range of passive and active motion. There are no carotid bruits on auscultation. Oropharynx exam reveals: No significant mouth dryness, good dental hygiene, moderate airway crowding secondary to Mallampati class III, small airway entry, tonsillar size of 2-3+ bilaterally.  Tongue protrudes centrally and palate elevates symmetrically, neck circumference of 19-1/2 inches.  Minimal overbite.   Chest: Clear to auscultation without  wheezing, rhonchi or crackles noted.  Heart: S1+S2+0, regular and normal without murmurs, rubs or gallops noted.   Abdomen: Soft, non-tender and non-distended.  Extremities: There is no obvious edema in the distal lower extremities bilaterally.   Skin: Warm and dry without trophic changes noted.   Musculoskeletal: exam reveals no obvious joint deformities.   Neurologically:  Mental status: The patient is awake, alert and oriented in all 4 spheres. Her immediate and remote memory, attention, language skills and fund of knowledge are appropriate. There is no evidence of aphasia, agnosia, apraxia or anomia. Speech is clear with normal prosody and enunciation. Thought process is linear. Mood is normal  and affect is normal.  Cranial nerves II - XII are as described above under HEENT exam.  Motor exam: Normal bulk, strength and tone is noted. There is no obvious action or resting tremor.  Fine motor skills and coordination: grossly intact.  Cerebellar testing: No dysmetria or intention tremor. There is no truncal or gait ataxia.  Sensory exam: intact to light touch in the upper and lower extremities.  Gait, station and balance: She stands easily. No veering to one side is noted. No leaning to one side is noted. Posture is age-appropriate and stance is narrow based. Gait shows normal stride length and normal pace. No problems turning are noted.   Assessment and Plan:  In summary, Peggy Rodriguez is a very pleasant 36 y.o.-year old female with an underlying medical history of diverticulitis, allergies, and morbid obesity with a BMI of over 45, whose history and physical exam are in keeping with obstructive sleep apnea.  She was originally diagnosed in 2007 but did not pursue PAP therapy at the time.  She has had some weight fluctuation.   I had a long chat with the patient about my findings and the diagnosis of sleep apnea, particularly OSA, its prognosis and treatment options. We talked about  medical/conservative treatments, surgical interventions and non-pharmacological approaches for symptom control. I explained, in particular, the risks and ramifications of untreated moderate to severe OSA, especially with respect to developing cardiovascular disease down the road, including congestive heart failure (CHF), difficult to treat hypertension, cardiac arrhythmias (particularly A-fib), neurovascular complications including TIA, stroke and dementia. Even type 2 diabetes has, in part, been linked to untreated OSA. Symptoms of untreated OSA may include (but may not be limited to) daytime sleepiness, nocturia (i.e. frequent nighttime urination), memory problems, mood irritability and suboptimally controlled or worsening mood disorder such as depression and/or anxiety, lack of energy, lack of motivation, physical discomfort, as well as recurrent headaches, especially morning or nocturnal headaches. We talked about the importance of maintaining a healthy lifestyle and striving for healthy weight.  The importance of complete and ongoing smoking cessation was also addressed.  In addition, we talked about the importance of striving for and maintaining good sleep hygiene. I recommended the following at this time: sleep study.  I outlined the differences between a laboratory attended sleep study which is considered more comprehensive and accurate over the option of a home sleep test (HST); the latter may lead to underestimation of sleep disordered breathing in some instances and does not help with diagnosing upper airway resistance syndrome and is not accurate enough to diagnose primary central sleep apnea typically. I explained the different sleep test procedures to the patient in detail and also outlined possible surgical and non-surgical treatment options of OSA, including the use of a pressure airway pressure (PAP) device (ie CPAP, AutoPAP/APAP or BiPAP in certain circumstances), a custom-made dental device (aka  oral appliance, which would require a referral to a specialist dentist or orthodontist typically, and is generally speaking not considered a good choice for patients with full dentures or edentulous state), upper airway surgical options, such as traditional UPPP (which is not considered a first-line treatment) or the Inspire device (hypoglossal nerve stimulator, which would involve a referral for consultation with an ENT surgeon, after careful selection, following inclusion criteria). I explained the PAP treatment option to the patient in detail, as this is generally considered first-line treatment.  The patient indicated that she would be willing to try PAP therapy, if the need arises.  I explained the importance of being compliant with PAP treatment, not only for insurance purposes but primarily to improve patient's symptoms symptoms, and for the patient's long term health benefit, including to reduce Her cardiovascular risks longer-term.    We will pick up our discussion about the next steps and treatment options after testing.  We will keep her posted as to the test results by phone call and/or MyChart messaging where possible.  We will plan to follow-up in sleep clinic accordingly as well.  I answered all her questions today and the patient was in agreement.   I encouraged her to call with any interim questions, concerns, problems or updates or email [place] through [facility].  Generally speaking, sleep test authorizations may take up to 2 weeks, sometimes less, sometimes longer, the patient is encouraged to get in touch with [place] if they do not hear back from the sleep lab staff directly within the next 2 weeks.  Thank you very much for allowing me to participate in the care of this nice patient. If I can be of any further assistance to you please do not hesitate to call me at [phone].  Sincerely,   Star Age, MD, PhD

## 2022-05-07 DIAGNOSIS — Z419 Encounter for procedure for purposes other than remedying health state, unspecified: Secondary | ICD-10-CM | POA: Diagnosis not present

## 2022-06-04 ENCOUNTER — Telehealth: Payer: Self-pay | Admitting: Nurse Practitioner

## 2022-06-04 NOTE — Telephone Encounter (Signed)
Called and spoke to pt again , got her a apt Monday she has no further question

## 2022-06-04 NOTE — Telephone Encounter (Signed)
Did patient have result notes from 03/27/2022? If not labs were normal. Please schedule patient for OV to assess Asthma. Thank you

## 2022-06-05 ENCOUNTER — Telehealth: Payer: Self-pay | Admitting: Neurology

## 2022-06-05 NOTE — Telephone Encounter (Signed)
NPS[employer]- [facility] wellcare auth: [id number] (exp. 06/04/22 to 09/02/22)   Patient is scheduled at [facility] for 06/09/22 at 8 pm.

## 2022-06-06 DIAGNOSIS — Z419 Encounter for procedure for purposes other than remedying health state, unspecified: Secondary | ICD-10-CM | POA: Diagnosis not present

## 2022-06-09 ENCOUNTER — Ambulatory Visit (INDEPENDENT_AMBULATORY_CARE_PROVIDER_SITE_OTHER): Payer: Medicaid Other | Admitting: Nurse Practitioner

## 2022-06-09 ENCOUNTER — Ambulatory Visit (INDEPENDENT_AMBULATORY_CARE_PROVIDER_SITE_OTHER): Payer: Medicaid Other | Admitting: Neurology

## 2022-06-09 ENCOUNTER — Encounter: Payer: Self-pay | Admitting: Nurse Practitioner

## 2022-06-09 ENCOUNTER — Ambulatory Visit (INDEPENDENT_AMBULATORY_CARE_PROVIDER_SITE_OTHER): Payer: Medicaid Other

## 2022-06-09 ENCOUNTER — Ambulatory Visit: Payer: Medicaid Other | Admitting: Nurse Practitioner

## 2022-06-09 DIAGNOSIS — R062 Wheezing: Secondary | ICD-10-CM | POA: Diagnosis not present

## 2022-06-09 DIAGNOSIS — J069 Acute upper respiratory infection, unspecified: Secondary | ICD-10-CM

## 2022-06-09 DIAGNOSIS — G4719 Other hypersomnia: Secondary | ICD-10-CM

## 2022-06-09 DIAGNOSIS — R351 Nocturia: Secondary | ICD-10-CM

## 2022-06-09 DIAGNOSIS — G4733 Obstructive sleep apnea (adult) (pediatric): Secondary | ICD-10-CM | POA: Diagnosis not present

## 2022-06-09 DIAGNOSIS — G4720 Circadian rhythm sleep disorder, unspecified type: Secondary | ICD-10-CM

## 2022-06-09 DIAGNOSIS — Z820 Family history of epilepsy and other diseases of the nervous system: Secondary | ICD-10-CM

## 2022-06-09 MED ORDER — ALBUTEROL SULFATE HFA 108 (90 BASE) MCG/ACT IN AERS: 2.0000 | INHALATION_SPRAY | Freq: Four times a day (QID) | RESPIRATORY_TRACT | 0 refills | Status: DC | PRN

## 2022-06-09 MED ORDER — PSEUDOEPH-BROMPHEN-DM 30-2-10 MG/5ML PO SYRP: 5.0000 mL | ORAL_SOLUTION | Freq: Four times a day (QID) | ORAL | 0 refills | Status: DC | PRN

## 2022-06-09 MED ORDER — AZITHROMYCIN 250 MG PO TABS: ORAL_TABLET | ORAL | 0 refills | Status: AC

## 2022-06-09 MED ORDER — PREDNISONE 10 MG (21) PO TBPK: ORAL_TABLET | ORAL | 0 refills | Status: DC

## 2022-06-09 NOTE — Patient Instructions (Signed)

## 2022-06-09 NOTE — Progress Notes (Signed)
   Virtual Visit  Note Due to COVID-19 pandemic this visit was conducted virtually. This visit type was conducted due to national recommendations for restrictions regarding the COVID-19 Pandemic (e.g. social distancing, sheltering in place) in an effort to limit this patient's exposure and mitigate transmission in our community. All issues noted in this document were discussed and addressed.  A physical exam was not performed with this format.  I connected with Peggy Rodriguez on 06/09/22 at 11;05 am  by telephone and verified that I am speaking with the correct person using two identifiers. Peggy Rodriguez is currently located at home during visit. The provider, Daryll Drown, NP is located in their office at time of visit.  I discussed the limitations, risks, security and privacy concerns of performing an evaluation and management service by telephone and the availability of in person appointments. I also discussed with the patient that there may be a patient responsible charge related to this service. The patient expressed understanding and agreed to proceed.   History and Present Illness:  URI  This is a recurrent problem. The current episode started in the past 7 days. The problem has been gradually worsening. There has been no fever. Associated symptoms include congestion, sinus pain and wheezing. Pertinent negatives include no nausea, rash, sore throat or vomiting. She has tried antihistamine and decongestant for the symptoms. The treatment provided no relief.      Review of Systems  Constitutional: Negative.  Negative for chills and fever.  HENT:  Positive for congestion and sinus pain. Negative for sore throat.   Respiratory:  Positive for wheezing.   Cardiovascular: Negative.   Gastrointestinal: Negative.  Negative for heartburn, nausea and vomiting.  Genitourinary: Negative.   Skin: Negative.  Negative for itching and rash.  Neurological: Negative.   All other systems reviewed  and are negative.    Observations/Objective: Televisit patient not in distress  Assessment and Plan: Patient presents with symptoms of upper respiratory infection with no resolution in the past few weeks.  Patient is reporting wheezing, worsening congestion, head pressure and cough.  I push  Take meds as prescribed - Use a cool mist humidifier  -Use saline nose sprays frequently -Force fluids -For fever or aches or pains- take Tylenol or ibuprofen. -If symptoms do not improve, she may need to be COVID tested to rule this out Follow up with worsening unresolved symptoms   Follow Up Instructions: Follow-up with worsening unresolved symptoms    I discussed the assessment and treatment plan with the patient. The patient was provided an opportunity to ask questions and all were answered. The patient agreed with the plan and demonstrated an understanding of the instructions.   The patient was advised to call back or seek an in-person evaluation if the symptoms worsen or if the condition fails to improve as anticipated.  The above assessment and management plan was discussed with the patient. The patient verbalized understanding of and has agreed to the management plan. Patient is aware to call the clinic if symptoms persist or worsen. Patient is aware when to return to the clinic for a follow-up visit. Patient educated on when it is appropriate to go to the emergency department.   Time call ended:  11:20 am   I provided 15 minutes of  non face-to-face time during this encounter.    Daryll Drown, NP

## 2022-06-16 NOTE — Addendum Note (Signed)
Addended by: Huston Foley on: 06/16/2022 06:06 PM   Modules accepted: Orders

## 2022-06-16 NOTE — Procedures (Signed)
Physician Interpretation:     [facility] SPLIT NIGHT INTERPRETATION REPORT   STUDY DATE: 06/09/2022     PATIENT NAME:  Peggy Rodriguez, Peggy Rodriguez         DATE OF BIRTH:  [birth year]  PATIENT ID:  [id number]    TYPE OF STUDY:  SPLIT  READING PHYSICIAN: Huston Foley, MD, PhD SCORING TECHNICIAN: Zenon Mayo Fields   History: 36 year old female with a history of diverticulitis, allergies,?and morbid obesity with a BMI of over 45, who reports snoring and excessive daytime somnolence and a prior diagnosis of obstructive sleep apnea. Her Epworth sleepiness score is 17 out of 24, fatigue severity score is 30 out of 63. She has not been on PAP therapy.?She has had significant weight fluctuation. Height: 71.0 in Weight: 334 lb (BMI 46) Neck Size: 19.5 in Medications: Nexplanon  DESCRIPTION: A sleep technologist was in attendance for the duration of the recording.  Data collection, scoring, video monitoring, and reporting were performed in compliance with the AASM Manual for the Scoring of Sleep and Associated Events; (Hypopnea is scored based on the criteria listed in Section VIII D. 1b in the AASM Manual V2.6 using a 4% oxygen desaturation rule or Hypopnea is scored based on the criteria listed in Section VIII D. 1a in the AASM Manual V2.6 using 3% oxygen desaturation and /or arousal rule).  A physician certified by the American Board of Sleep Medicine reviewed each epoch of the study.   FINDINGS:  Please refer to the attached summary for additional quantitative information.  STUDY DETAILS: Lights off was at 21:06: and lights on 05:01: (474 minutes hours in bed). This study was performed with an initial diagnostic portion followed by positive airway pressure titration.  DIAGNOSTIC ANALYSIS   SLEEP CONTINUITY AND SLEEP ARCHITECTURE:  The diagnostic portion of the study began at 21:06 and ended at 01:29, for a recording time of 4h 22.20m minutes.  Total sleep time was 129 minutes minutes  (0.0% supine;  39.5% lateral;  60.5% prone, 17.8% REM sleep), with a decreased sleep efficiency at 49.1%. Sleep latency was increased at 104.0 minutes. REM sleep latency was normal at 70.0 minutes.  Arousal index was 7.0 /hr. Of the total sleep time, the percentage of stage N1 sleep was 5.0%, stage N2 sleep was 53.5%, stage N3 sleep was 23.6%, and REM sleep was 17.8%. There were 1 Stage R periods observed during this portion of the study, 6 awakenings (i.e. transitions to Stage W from any sleep stage), and 19.0 total stage transitions. Wake after sleep onset (WASO) time accounted for 29 minutes with mild to moderate sleep fragmentation noted.   AROUSAL (Baseline): There were 11.0 arousals in total, for an arousal index of 5.1 arousals/hour.  Of these, 6.0 were identified as respiratory-related arousals (2.8 /hr), 0 were PLM-related arousals (0.0 /hr), and 9 were non-specific arousals (4.2 /hr)   RESPIRATORY MONITORING:  Based on CMS criteria (using a 4% oxygen desaturation rule for scoring hypopneas), there were 19 apneas (17 obstructive; 2 central; 0 mixed), and 93 hypopneas.  Apnea index was 7.9. Hypopnea index was 32.6. The apnea-hypopnea index was 40.5 overall (0.0 supine; 80.9 REM, 0.0 supine REM). There were 0 respiratory effort-related arousals (RERAs).  The RERA index was 0.0 events/hr. Total respiratory disturbance index (RDI) was 40.5 events/hr. RDI results showed: supine RDI  0.0 /hr; non-supine RDI 40.5 /hr; REM RDI 80.9 /hr, supine REM RDI 0.0 /hr.  Based on AASM criteria (using a 3% oxygen desaturation and /or arousal  rule for scoring hypopneas), there were 19 apneas (17 obstructive; 2 central; 0 mixed), and 93 hypopneas.  Apnea index was 7.9. Hypopnea index was 33.0. The apnea-hypopnea index was 40.9 overall (0.0 supine; 80.9 REM, 0.0 supine REM). There were 0 respiratory effort-related arousals (RERAs). Total respiratory disturbance index (RDI) was 40.9 events/hr. RDI results showed: supine RDI  0.0 /hr; non-supine RDI 40.9 /hr; REM RDI 80.9 /hr, supine REM RDI 0.0 /hr.   Respiratory events were associated with oxyhemoglobin desaturations (nadir 69%) from a normal baseline (mean 97%). Total time spent at, or below 88% was 6.6 minutes, or 5.1%  of total sleep time.   LIMB MOVEMENTS: There were 0 periodic limb movements of sleep (0.0/hr), of which 0 (0.0/hr) were associated with an arousal.   OXIMETRY: Total sleep time spent at, or below 88% was 5.2 minutes, or 4.0% of total sleep time. Snoring was classified as .   BODY POSITION: Duration of total sleep and percent of total sleep in their respective position is as follows: supine 00 minutes minutes (0.0%), non-supine 129.0 minutes (100.0%); right 51 minutes minutes (39.5%), left 00 minutes minutes (0.0%), and prone 78 minutes minutes (60.5%). Total supine REM sleep time was 00 minutes minutes (0.0% of total REM sleep).   Analysis of electrocardiogram activity showed the highest heart rate for the baseline portion of the study was 110.0 beats per minute.  The average heart rate during sleep was 80 bpm, while the highest heart rate for the same period was 96 bpm.    TREATMENT ANALYSIS SLEEP CONTINUITY AND SLEEP ARCHITECTURE:  The treatment portion of the study began at 01:29 and ended at 05:01, for a recording time of 3h 32.56m minutes.  Total sleep time was 188 minutes minutes (0.0% supine;  54.5% lateral; 45.5% prone, 24.2% REM sleep), with a normal sleep efficiency at 88.7%. Sleep latency was normal at 9.5 minutes. REM sleep latency was decreased at 34.0 minutes.  Arousal index was 4.8 /hr. Of the total sleep time, the percentage of stage N1 sleep was 6.1%, stage N3 sleep was 8.5%, and REM sleep was 24.2%. There were 2 Stage R periods observed during this portion of the study, 6 awakenings (i.e. transitions to Stage W from any sleep stage), and 26.0 total stage transitions. Wake after sleep onset (WASO) time accounted for 14 minutes.    AROUSAL: There were 12.0 arousals in total, for an arousal index of 3.8 arousals/hour.  Of these, 3.0 were identified as respiratory-related arousals (1.0 /hr), 0 were PLM-related arousals (0.0 /hr), and 12 were non-specific arousals (3.8 /hr)  RESPIRATORY MONITORING:    While on PAP therapy, based on CMS criteria, the apnea-hypopnea index was 8.0 overall (0.0 supine; 2.2 REM).   While on PAP therapy, based on AASM criteria, the apnea-hypopnea index was 8.0 overall (0.0 supine; 2.2 REM).   Respiratory events were associated with oxyhemoglobin desaturation (nadir 84.0%) from a mean of 96.0%.  Total time spent at, or below 88% was 0.4 minutes, or 0.2%  of total sleep time.  Snoring was absent:  . There were 0.0 occurrences of Cheyne Stokes breathing.  LIMB MOVEMENTS: There were 0 periodic limb movements of sleep (0.0/hr), of which 0 (0.0/hr) were associated with an arousal.   OXIMETRY: Total sleep time spent at, or below 88% was 0.1 minutes, or 0.0% of total sleep time.    BODY POSITION: Duration of total sleep and percent of total sleep in their respective position is as follows: supine 00 minutes minutes (0.0%),  non-supine 188.0 minutes (100.0%); right 102 minutes minutes (54.5%), left 00 minutes minutes (0.0%), and prone 85 minutes minutes (45.5%). Total supine REM sleep time was 00 minutes minutes (0.0% of total REM sleep).   TITRATION DETAILS (SEE ALSO TABLE AT THE END OF THE REPORT):  The patient qualified for an emergency split study with a baseline AHI of 40.5/h, O2 nadir 69%.  She was fitted with a wide ResMed F30i fullface mask.  CPAP was initiated at a pressure of 5 cm and gradually titrated to a final pressure of 9 cm with EPR of 2.  On the final pressure, she achieved a total sleep time of 144.5 minutes with a residual AHI of 1.25/h, O2 nadir 89% with non-supine REM sleep achieved.     EEG: Review of the EEG showed no abnormal electrical discharges and symmetrical bihemispheric  findings.     EKG: The EKG revealed normal sinus rhythm (NSR). The average heart rate during sleep was 78 bpm.   AUDIO/VIDEO REVIEW: The audio and video review did not show any abnormal or unusual behaviors, movements, phonations or vocalizations. The patient took no bathroom breaks.  Snoring was mild to moderate and improved with PAP therapy.   POST-STUDY QUESTIONNAIRE: Post study, the patient indicated, that sleep was better than usual.  She indicated that she was nervous at first, once (the PAP mask) was on, she awoke a few times in a panic but she indicated she will get used to it.     IMPRESSION:    1. Severe Obstructive sleep apnea (OSA) 2. Dysfunctions associated with sleep stages or arousal from sleep   RECOMMENDATIONS:    1. This patient has severe obstructive sleep apnea and responded well on CPAP therapy. The patient qualified for an emergency split sleep study secondary to severe sleep disordered breathing. The baseline AHI was 40.5/h, O2 nadir 69%.  CPAP of 9 cm resulted in significant reduction of her sleep disordered breathing.  I recommend home CPAP therapy at a pressure of 9 cm via ResMed F30i fullface mask, with heated humidity (or mask of choice, sized to fit). The patient will be advised to be fully compliant with PAP therapy to improve sleep related symptoms and decrease long term cardiovascular risks. Please note that untreated obstructive sleep apnea may carry additional perioperative morbidity. Patients with significant obstructive sleep apnea should receive perioperative PAP therapy and the surgeons and particularly the anesthesiologist should be informed of the diagnosis and the severity of the sleep disordered breathing. 2. This study shows sleep fragmentation and abnormal sleep stage percentages; these are nonspecific findings and per se do not signify an intrinsic sleep disorder or a cause for the patient's sleep-related symptoms. Causes include (but are not limited to)  the first night effect of the sleep study, circadian rhythm disturbances, medication effect or an underlying mood disorder or medical problem.  3. The patient should be cautioned not to drive, work at heights, or operate dangerous or heavy equipment when tired or sleepy. Review and reiteration of good sleep hygiene measures should be pursued with any patient. 4. The patient will be seen in follow-up in the sleep clinic at [facility] for discussion of the test results, symptom and treatment compliance review, further management strategies, etc. The referring provider will be notified of the test results.  I certify that I have reviewed the entire raw data recording prior to the issuance of this report in accordance with the Standards of Accreditation of the American Academy of Sleep Medicine (AASM).  Huston Foley, MD, PhD Medical Director, [facility]) Diplomat, ABPN (Neurology and Sleep)         Technical Report:   [facility] Split Summary    General Information  Name: Peggy Rodriguez, Peggy Rodriguez BMI: 96.04 Physician: Huston Foley, MD  ID: [id number] Height: 71.0 in Technician: Margaretann Loveless, RPSGT  Sex: Female Weight: 334.0 lb Record: [id number]  Age: 47 [1986/04/12] Date: 06/09/2022     Medical & Medication History    36 year old female with an underlying medical history of diverticulitis, allergies, and morbid obesity with a BMI of over 45, who reports snoring and excessive daytime somnolence.   Nexplanon   Sleep Disorder      Comments   The patient came into the sleep lab for a PSG. The patient was split due to having an AHI great than 40 after 2 hours of total sleep time. The patient was fitted with a ResMed F30i (FFM) size Wide. The mask was a good fit to face, the patient tolerated the mask well. CPAP was started at [facility]. CPAP was titrated up to 9cmH2O with EPR of 2. No restroom trips. EKG kept in NSR. Mild to  moderate snoring noted. All respiratory events scored with a 4% desat. The patient slept lateral and prone.   Baseline Sleep Stage Information Baseline start time: 09:06:57 PM Baseline end time: 01:29:22 AM   Time Total Supine Side Prone Upright  Recording 4h 22.7m 0h 0.62m 2h 11.72m 2h 11.59m 0h 0.13m  Sleep 2h 9.60m 0h 0.74m 0h 51.64m 1h 18.46m 0h 0.63m   Latency N1 N2 N3 REM Onset Per. Slp. Eff.  Actual 0h 0.31m 0h 3.57m 0h 21.17m 1h 10.84m 1h 44.56m 1h 45.68m 49.14%   Stg Dur Wake N1 N2 N3 REM  Total 133.5 6.5 69.0 30.5 23.0  Supine 0.0 0.0 0.0 0.0 0.0  Side 80.5 3.0 25.0 0.0 23.0  Prone 53.0 3.5 44.0 30.5 0.0  Upright 0.0 0.0 0.0 0.0 0.0   Stg % Wake N1 N2 N3 REM  Total 50.9 5.0 53.5 23.6 17.8  Supine 0.0 0.0 0.0 0.0 0.0  Side 30.7 2.3 19.4 0.0 17.8  Prone 20.2 2.7 34.1 23.6 0.0  Upright 0.0 0.0 0.0 0.0 0.0    CPAP Sleep Stage Information CPAP start time: 01:29:22 AM CPAP end time: 05:01:43 AM   Time Total Supine Side Prone Upright  Recording (TRT) 3h 32.55m 0h 2.50m 1h 56.67m 1h 33.43m 0h 0.58m  Sleep (TST) 3h 8.70m 0h 0.37m 1h 42.38m 1h 25.56m 0h 0.51m   Latency N1 N2 N3 REM Onset Per. Slp. Eff.  Actual 0h 0.63m 0h 2.47m 1h 32.25m 0h 34.52m 0h 9.26m 0h 9.40m 88.68%   Stg Dur Wake N1 N2 N3 REM  Total 24.0 11.5 115.0 16.0 45.5  Supine 2.5 0.0 0.0 0.0 0.0  Side 13.5 8.0 81.0 0.0 13.5  Prone 8.0 3.5 34.0 16.0 32.0  Upright 0.0 0.0 0.0 0.0 0.0   Stg % Wake N1 N2 N3 REM  Total 11.3 6.1 61.2 8.5 24.2  Supine 1.2 0.0 0.0 0.0 0.0  Side 6.4 4.3 43.1 0.0 7.2  Prone 3.8 1.9 18.1 8.5 17.0  Upright 0.0 0.0 0.0 0.0 0.0    Baseline Respiratory Information Apnea Summary Sub Supine Side Prone Upright  Total 17 Total 17 0 17 0 0    REM 15 0 15 0 0    NREM 2 0 2 0 0  Obs 17 REM 15 0 15 0 0  NREM 2 0 2 0 0  Mix 0 REM 0 0 0 0 0    NREM 0 0 0 0 0  Cen 0 REM 0 0 0 0 0    NREM 0 0 0 0 0   Rera Summary Sub Supine Side Prone Upright  Total 0 Total 0 0 0 0 0    REM 0 0 0 0 0    NREM 0 0 0 0 0   Hypopnea  Summary Sub Supine Side Prone Upright  Total 71 Total 71 0 63 8 0    REM 16 0 16 0 0    NREM 55 0 47 8 0   4% Hypopnea Summary Sub Supine Side Prone Upright  Total (4%) 70 Total 70 0 62 8 0    REM 16 0 16 0 0    NREM 54 0 46 8 0     AHI Total Obs Mix Cen  40.93 Apnea 7.91 7.91 0.00 0.00   Hypopnea 33.02 -- -- --  40.47 Hypopnea (4%) 32.56 -- -- --    Total Supine Side Prone Upright  Position AHI 40.93 0.00 94.12 6.15 0.00  REM AHI 80.87   NREM AHI 32.26   Position RDI 40.93 0.00 94.12 6.15 0.00  REM RDI 80.87   NREM RDI 32.26    4% Hypopnea Total Supine Side Prone Upright  Position AHI (4%) 40.47 0.00 92.94 6.15 0.00  REM AHI (4%) 80.87   NREM AHI (4%) 31.70   Position RDI (4%) 40.47 0.00 92.94 6.15 0.00  REM RDI (4%) 80.87   NREM RDI (4%) 31.70    CPAP Respiratory Information Apnea Summary Sub Supine Side Prone Upright  Total 2 Total 2 0 1 1 0    REM 1 0 0 1 0    NREM 1 0 1 0 0  Obs 0 REM 0 0 0 0 0    NREM 0 0 0 0 0  Mix 0 REM 0 0 0 0 0    NREM 0 0 0 0 0  Cen 2 REM 1 0 0 1 0    NREM 1 0 1 0 0   Rera Summary Sub Supine Side Prone Upright  Total 0 Total 0 0 0 0 0    REM 0 0 0 0 0    NREM 0 0 0 0 0   Hypopnea Summary Sub Supine Side Prone Upright  Total 23 Total 23 0 17 6 0    REM 6 0 1 5 0    NREM 17 0 16 1 0   4% Hypopnea Summary Sub Supine Side Prone Upright  Total (4%) 23 Total 23 0 17 6 0    REM 6 0 1 5 0    NREM 17 0 16 1 0     AHI Total Obs Mix Cen  7.98 Apnea 0.64 0.00 0.00 0.64   Hypopnea 7.34 -- -- --  7.98 Hypopnea (4%) 7.34 -- -- --    Total Supine Side Prone Upright  Position AHI 7.98 0.00 10.54 4.91 0.00  REM AHI 9.23   NREM AHI 7.58   Position RDI 7.98 0.00 10.54 4.91 0.00  REM RDI 9.23   NREM RDI 7.58    4% Hypopnea Total Supine Side Prone Upright  Position AHI (4%) 7.98 0.00 10.54 4.91 0.00  REM AHI (4%) 9.23   NREM AHI (4%) 7.58   Position RDI (4%) 7.98 0.00 10.54 4.91 0.00  REM RDI (4%)  9.23   NREM RDI (4%) 7.58     Desaturation Information (Baseline)  <100% <90% <80% <70% <60% <50% <40%  Supine 0 0 0 0 0 0 0  Side 106 32 9 1 0 0 0  Prone 19 0 0 0 0 0 0  Upright 0 0 0 0 0 0 0  Total 125 32 9 1 0 0 0  Desaturation threshold setting: 3% Minimum desaturation setting: 10 seconds SaO2 nadir: 69% The longest event was a 49 sec obstructive Hypopneawith a minimum SaO2 of 92%. The lowest SaO2 was 69% associated with a 26 sec obstructive Apnea. Awakening/Arousal Information (Baseline) # of Awakenings 6  Wake after sleep onset 29.56m  Wake after persistent sleep 29.75m   Arousal Assoc. Arousals Index  Apneas 4 1.9  Hypopneas 2 0.9  Leg Movements 0 0.0  Snore 0.0 0.0  PTT Arousals 0 0.0  Spontaneous 9 4.2  Total 15 7.0   Desaturation Information (CPAP)  <100% <90% <80% <70% <60% <50% <40%  Supine 0 0 0 0 0 0 0  Side 22 6 0 0 0 0 0  Prone 7 1 0 0 0 0 0  Upright 0 0 0 0 0 0 0  Total 29 7 0 0 0 0 0  Desaturation threshold setting: 3% Minimum desaturation setting: 10 seconds SaO2 nadir: 84% The longest event was a 39 sec obstructive Hypopnea with a minimum SaO2 of 90%. The lowest SaO2 was 84% associated with a 18 sec obstructive Hypopnea. Awakening/Arousal Information (CPAP) # of Awakenings 6  Wake after sleep onset 14.83m  Wake after persistent sleep 14.45m   Arousal Assoc. Arousals Index  Apneas 1 0.3  Hypopneas 2 0.6  Leg Movements 0 0.0  Snore 0.0 0.0  PTT Arousals 0 0.0  Spontaneous 12 3.8  Total 15 4.8     EKG Rates (Baseline) EKG Avg Max Min  Awake 85 110 69  Asleep 80 96 62  EKG Events: Tachycardia Myoclonus Information (Baseline) PLMS LMs Index  Total LMs during PLMS 0 0.0  LMs w/ Microarousals 0 0.0   LM LMs Index  w/ Microarousal 0 0.0  w/ Awakening 0 0.0  w/ Resp Event 0 0.0  Spontaneous 0 0.0  Total 0 0.0   EKG Rates (CPAP) EKG Avg Max Min  Awake 78 101 61  Asleep 77 92 58  EKG Events: Tachycardia Myoclonus Information (CPAP) PLMS LMs Index  Total LMs  during PLMS 0 0.0  LMs w/ Microarousals 0 0.0   LM LMs Index  w/ Microarousal 0 0.0  w/ Awakening 0 0.0  w/ Resp Event 0 0.0  Spontaneous 10 3.2  Total 10 3.2     Titration Table:  [facility] CPAP/Bilevel Report    General Information  Name: Peggy Rodriguez, Peggy Rodriguez BMI: 16 Physician: Huston Foley  ID: [id number] Height: 71 in Technician: Margaretann Loveless  Sex: Female Weight: 334 lb Record: [id number]  Age: 89 [14-Jun-1986] Date: 06/09/2022 Scorer: Zenon Mayo Fields   Recommended Settings IPAP: N/A cmH20 EPAP: N/A cmH2O AHI: N/A AHI (4%): N/A   Pressure IPAP/EPAP 00 04 / 00 05 07 09   O2 Vol 0.0 0.0 0.0 0.0 0.0  Time TRT 262.11m 1.45m 43.15m 12.43m 155.79m   TST 129.78m 0.29m 31.37m 12.1m 144.3m  Sleep Stage % Wake 50.9 100.0 28.7 0.0 6.8   % REM 17.8 0.0 3.2 100.0 22.1   % N1 5.0 0.0 12.9 0.0 5.2   % N2 53.5 0.0 83.9  0.0 61.6   % N3 23.6 0.0 0.0 0.0 11.1  Respiratory Total Events 88 0 19 3 3    Obs. Apn. 17 0 0 0 0   Mixed Apn. 0 0 0 0 0   Cen. Apn. 0 0 0 0 2   Hypopneas 71 0 19 3 1    AHI 40.93 0.00 36.77 14.40 1.25   Supine AHI 0.00 0.00 0.00 0.00 0.00   Prone AHI 6.15 0.00 45.00 14.40 0.87   Side AHI 94.12 0.00 35.56 0.00 1.59  Respiratory (4%) Hypopneas (4%) 70.00 0.00 19.00 3.00 1.00   AHI (4%) 40.47 0.00 36.77 14.40 1.25   Supine AHI (4%) 0.00 0.00 0.00 0.00 0.00   Prone AHI (4%) 6.15 0.00 45.00 14.40 0.87   Side AHI (4%) 92.94 0.00 35.56 0.00 1.59  Desat Profile <= 90% 10.87257m 0.60331m 0.1582m 0.6252m 0.76582m   <= 80% 2.9182m 0.83331m 0.12331m 0.72331m 0.41331m   <= 70% 0.3082m 0.68331m 0.14331m 0.20331m 0.69331m   <= 60% 0.6657m 0.43331m 0.37331m 0.9331m 0.1331m  Arousal Index Apnea 1.9 0.0 0.0 0.0 0.4   Hypopnea 0.9 0.0 3.9 0.0 0.0   LM 0.0 0.0 0.0 0.0 0.0   Spontaneous 4.2 0.0 9.7 0.0 2.9

## 2022-06-17 ENCOUNTER — Telehealth: Payer: Self-pay

## 2022-06-17 NOTE — Telephone Encounter (Signed)
I called pt. I advised pt that Dr. Frances Furbish reviewed their sleep study results and found that pt has severe osa but did well with the CPAP during her sleep study. Dr. Frances Furbish recommends that pt start a CPAP at home. I reviewed PAP compliance expectations with the pt. Pt is agreeable to starting a CPAP. I advised pt that an order will be sent to a DME, [facility], and [facility] will call the pt within about one week after they file with the pt's insurance. [facility] will show the pt how to use the machine, fit for masks, and troubleshoot the CPAP if needed. A follow up appt was made for insurance purposes with Maralyn Sago, NP on 09/02/22 at 12:45pm. Pt verbalized understanding to arrive 15 minutes early and bring their CPAP.  Pt verbalized understanding of results. Pt had no questions at this time but was encouraged to call back if questions arise. I have sent the order to [facility] and have received confirmation that they have received the order.

## 2022-06-17 NOTE — Telephone Encounter (Signed)
-----   Message from Huston Foley, MD sent at 06/16/2022  6:06 PM EST ----- Patient referred by PCP, seen by me on 05/06/2022, patient had a split night sleep study on 06/09/2022. Please call and notify patient that the recent sleep study showed severe  obstructive sleep apnea (OSA). She did well with CPAP during the study with significant improvement of the respiratory events. I would like start the patient on a new CPAP machine for home use. I placed the order in the chart.  Please advise patient that we will need a follow up appointment with either myself or one of our nurse practitioners in about 2-3 months post set-up to check for how they are doing on treatment and how well it's going with the machine in general. Most insurance company require a certain compliance percentage to continue to cover/pay for the machine. Please ask patient to schedule this FU appointment, according to the set-up date, which is the day they receive the machine. Please make sure, the patient understands the importance of keeping this window for the FU appointment, as it is often an Barista and not our rule. Failing to adhere to this may result in losing coverage for sleep apnea treatment, at which point some insurances require repeating the whole process. Plus, monitoring compliance data is usually good feedback for the patient as far as how they are doing, how many hours they are on it, how well the mask fits, etc.  Also remind patient, that any PAP machine or mask issues should be first addressed with the DME company, who provided the machine/supplies.  Please ask if patient has a preference regarding DME company, may depend on the insurance too.  Huston Foley, MD, PhD [facility])

## 2022-07-07 DIAGNOSIS — Z419 Encounter for procedure for purposes other than remedying health state, unspecified: Secondary | ICD-10-CM | POA: Diagnosis not present

## 2022-07-22 DIAGNOSIS — G4733 Obstructive sleep apnea (adult) (pediatric): Secondary | ICD-10-CM | POA: Diagnosis not present

## 2022-07-22 DIAGNOSIS — R400 Somnolence: Secondary | ICD-10-CM | POA: Diagnosis not present

## 2022-08-07 DIAGNOSIS — R400 Somnolence: Secondary | ICD-10-CM | POA: Diagnosis not present

## 2022-08-07 DIAGNOSIS — G4733 Obstructive sleep apnea (adult) (pediatric): Secondary | ICD-10-CM | POA: Diagnosis not present

## 2022-08-07 DIAGNOSIS — Z419 Encounter for procedure for purposes other than remedying health state, unspecified: Secondary | ICD-10-CM | POA: Diagnosis not present

## 2022-08-21 DIAGNOSIS — G4733 Obstructive sleep apnea (adult) (pediatric): Secondary | ICD-10-CM | POA: Diagnosis not present

## 2022-08-21 DIAGNOSIS — R400 Somnolence: Secondary | ICD-10-CM | POA: Diagnosis not present

## 2022-09-02 ENCOUNTER — Encounter: Payer: Self-pay | Admitting: Neurology

## 2022-09-02 ENCOUNTER — Telehealth: Payer: Medicaid Other | Admitting: Neurology

## 2022-09-02 NOTE — Progress Notes (Deleted)
Patient did not sign on for our scheduled video visit today.  I tried to call her, her voicemail box was not set up there was no answer.  I reviewed her CPAP data 08/02/22-08/31/22 showing usage 13/30 days at 53%, greater than 4 hours 40%.  Set pressure 9 cmH2O.  AHI 6.3, leak 54.2.

## 2022-09-05 DIAGNOSIS — G4733 Obstructive sleep apnea (adult) (pediatric): Secondary | ICD-10-CM | POA: Diagnosis not present

## 2022-09-05 DIAGNOSIS — Z419 Encounter for procedure for purposes other than remedying health state, unspecified: Secondary | ICD-10-CM | POA: Diagnosis not present

## 2022-09-05 DIAGNOSIS — R400 Somnolence: Secondary | ICD-10-CM | POA: Diagnosis not present

## 2022-09-10 ENCOUNTER — Telehealth: Payer: Medicaid Other | Admitting: Neurology

## 2022-09-10 NOTE — Progress Notes (Deleted)
Virtual Visit via Video Note  I connected with Peggy Rodriguez on 09/10/22 at  2:00 PM EST by a video enabled telemedicine application and verified that I am speaking with the correct person using two identifiers.  Location: Patient: *** Provider: ***   I discussed the limitations of evaluation and management by telemedicine and the availability of in person appointments. The patient expressed understanding and agreed to proceed.  History of Present Illness: Today, September 10, 2022 SS: Initially referred for sleep consultation due to snoring and excessive daytime somnolence.  Previously diagnosed with OSA but has never been on CPAP.  Had split-night sleep study 06/09/2022 showing severe OSA.  05/06/22 Dr. Rexene Alberts: I saw your patient, Peggy Rodriguez, upon your kind request in my sleep clinic to for initial consultation of her sleep disorder, in particular, concern for underlying obstructive sleep apnea.  The patient is unaccompanied today.  As you know, Peggy Rodriguez is a 37 year old female with an underlying medical history of diverticulitis, allergies, and morbid obesity with a BMI of over 45, who reports snoring and excessive daytime somnolence.  I reviewed your office note from 03/26/2022.  Her Epworth sleepiness score is 17 out of 24, fatigue severity score is 30 out of 63. She reports a prior diagnosis of obstructive sleep apnea in 2007, study was done in [place], [place], she has not been on PAP therapy.  She admits that she did not pursue it at the time, as she recalls, she had moderate sleep apnea at the time.  She was able to lose quite a bit of weight a few years ago in the realm of 80 pounds but in the past couple of years she has gained about 30 pounds back.  She is single, she lives with her 20 year old daughter who has witnessed her apneas from time to time.  Patient denies any recurrent morning headaches but has nocturia about once per average night.  She has an uncle with sleep apnea, on mom's  side.  She drinks caffeine in the form of hot tea, usually 1 cup/day and 1 can of soda per day.  She drinks alcohol very occasionally, maybe once a month, she quit smoking some 4 months ago.  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  They have a [place] pig in the household, no cat or dog.  She does not have a TV in her bedroom currently.  She has been suffering from allergies and switch from Zyrtec to Claritin recently.  Bedtime is generally around 10 PM and rise time around 7:30 AM.     Observations/Objective:   Assessment and Plan:   Follow Up Instructions:    I discussed the assessment and treatment plan with the patient. The patient was provided an opportunity to ask questions and all were answered. The patient agreed with the plan and demonstrated an understanding of the instructions.   The patient was advised to call back or seek an in-person evaluation if the symptoms worsen or if the condition fails to improve as anticipated.  I provided *** minutes of non-face-to-face time during this encounter.   Suzzanne Cloud, NP

## 2022-09-24 NOTE — Telephone Encounter (Signed)
This patient has no showed two mychart video visits with Judson Roch for initial cpap follow-up. Her setup date was 07/22/22 so she needs to be seen by 10/21/22 per insurance. Please call patient and offer to reschedule her appointment asap with either Sarah or Dr Rexene Alberts. Insurance requires this appointment and may potentially confiscate her machine or potentially she may have to pay out of pocket if she doesn't meet compliance requirements. Please also remind patient that if she misses a third appointment then unfortunately she may be dismissed from our office due to policy. Perhaps this next appointment should be in office rather than video? Thanks

## 2022-09-25 ENCOUNTER — Telehealth: Payer: Self-pay | Admitting: Neurology

## 2022-09-25 NOTE — Telephone Encounter (Signed)
Pt was informed to bring machine and power cord to the appointment.   DME and between dates are in pt's SnapShot. Pt told to bring CPAP and power cord to appointment

## 2022-10-01 ENCOUNTER — Telehealth (INDEPENDENT_AMBULATORY_CARE_PROVIDER_SITE_OTHER): Payer: Medicaid Other | Admitting: Neurology

## 2022-10-01 DIAGNOSIS — G4733 Obstructive sleep apnea (adult) (pediatric): Secondary | ICD-10-CM

## 2022-10-01 NOTE — Progress Notes (Signed)
Virtual Visit via Video Note  I connected with Peggy Rodriguez on 10/01/22 at 12:45 PM EDT by a video enabled telemedicine application and verified that I am speaking with the correct person using two identifiers.  Location: Patient: in her car  Provider: in the office    I discussed the limitations of evaluation and management by telemedicine and the availability of in person appointments. The patient expressed understanding and agreed to proceed.  History of Present Illness: Today 10/01/22: Here today for initial CPAP visit.  Had reported snoring and excessive daytime somnolence.  Had split-night sleep study 06/09/2022 showing severe OSA had significant improvement with CPAP. The baseline AHI was 40.5/h, O2 nadir 69%.  CPAP of 9 cm resulted in significant reduction of her sleep disordered breathing. Review of CPAP data 09/01/2022-09/30/2022 shows usage 25/30 days at 83%, greater than 4 hours 19 days at 63%.  Average usage days used 5 hours 49 minutes.  Set pressure 9 cmH2O.  EPR level 2.  Leak 39.8.  AHI 5.3. is using medium mask res med full face. Notices certain positions of sleep at night affect her sleep data. When wearing CPAP, notices the next day feel more energy, not falling asleep during the day, not requiring daytime nap.  Often times she falls asleep before putting CPAP on.  HISTORY  05/06/22 Dr.  Rexene Alberts: I saw your patient, Peggy Rodriguez, upon your kind request in my sleep clinic to for initial consultation of her sleep disorder, in particular, concern for underlying obstructive sleep apnea.  The patient is unaccompanied today.  As you know, Ms. Jezewski is a 37 year old female with an underlying medical history of diverticulitis, allergies, and morbid obesity with a BMI of over 45, who reports snoring and excessive daytime somnolence.  I reviewed your office note from 03/26/2022.  Her Epworth sleepiness score is 17 out of 24, fatigue severity score is 30 out of 63. She reports a prior  diagnosis of obstructive sleep apnea in 2007, study was done in [place], [place], she has not been on PAP therapy.  She admits that she did not pursue it at the time, as she recalls, she had moderate sleep apnea at the time.  She was able to lose quite a bit of weight a few years ago in the realm of 80 pounds but in the past couple of years she has gained about 30 pounds back.  She is single, she lives with her 24 year old daughter who has witnessed her apneas from time to time.  Patient denies any recurrent morning headaches but has nocturia about once per average night.  She has an uncle with sleep apnea, on mom's side.  She drinks caffeine in the form of hot tea, usually 1 cup/day and 1 can of soda per day.  She drinks alcohol very occasionally, maybe once a month, she quit smoking some 4 months ago.  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  They have a [place] pig in the household, no cat or dog.  She does not have a TV in her bedroom currently.  She has been suffering from allergies and switch from Zyrtec to Claritin recently.  Bedtime is generally around 10 PM and rise time around 7:30 AM.  Observations/Objective: Via virtual visit, is alert and oriented, speech is clear and concise, facial symmetry noted, moves about in her car  Assessment and Plan: 1.  Severe OSA on CPAP -Discussed the importance of improving her greater than 4-hour nightly compliance, currently is at 63%, I will pull her data in 1 month for comparison, she currently likes the fullface mask, but has a high leak of 39, AHI 5.3, will review the data in 1 month to see if compliance improves these parameters  Follow Up Instructions: 6 months in office with me   I discussed the assessment and treatment plan with the patient. The patient was provided an opportunity to ask questions and all were answered. The patient agreed with the plan  and demonstrated an understanding of the instructions.   The patient was advised to call back or seek an in-person evaluation if the symptoms worsen or if the condition fails to improve as anticipated.   Evangeline Dakin, DNP  [facility] [address] [place], [place] [address] [phone]

## 2022-10-01 NOTE — Patient Instructions (Signed)
Try to increase your nightly CPAP usage, for a minimum of 4 hours every night, we recommend minimum of greater than 4-hour usage 70% of the time for both clinical benefit and insurance requirements, I will follow-up with you in about 1 month to review the data.  Please let me know if you have any questions.

## 2022-10-06 DIAGNOSIS — R400 Somnolence: Secondary | ICD-10-CM | POA: Diagnosis not present

## 2022-10-06 DIAGNOSIS — G4733 Obstructive sleep apnea (adult) (pediatric): Secondary | ICD-10-CM | POA: Diagnosis not present

## 2022-10-06 DIAGNOSIS — Z419 Encounter for procedure for purposes other than remedying health state, unspecified: Secondary | ICD-10-CM | POA: Diagnosis not present

## 2022-10-29 ENCOUNTER — Encounter: Payer: Self-pay | Admitting: Neurology

## 2022-11-05 DIAGNOSIS — G4733 Obstructive sleep apnea (adult) (pediatric): Secondary | ICD-10-CM | POA: Diagnosis not present

## 2022-11-05 DIAGNOSIS — Z419 Encounter for procedure for purposes other than remedying health state, unspecified: Secondary | ICD-10-CM | POA: Diagnosis not present

## 2022-11-05 DIAGNOSIS — R400 Somnolence: Secondary | ICD-10-CM | POA: Diagnosis not present

## 2022-12-05 DIAGNOSIS — G4733 Obstructive sleep apnea (adult) (pediatric): Secondary | ICD-10-CM | POA: Diagnosis not present

## 2022-12-05 DIAGNOSIS — R400 Somnolence: Secondary | ICD-10-CM | POA: Diagnosis not present

## 2022-12-06 DIAGNOSIS — G4733 Obstructive sleep apnea (adult) (pediatric): Secondary | ICD-10-CM | POA: Diagnosis not present

## 2022-12-06 DIAGNOSIS — R400 Somnolence: Secondary | ICD-10-CM | POA: Diagnosis not present

## 2022-12-06 DIAGNOSIS — Z419 Encounter for procedure for purposes other than remedying health state, unspecified: Secondary | ICD-10-CM | POA: Diagnosis not present

## 2022-12-30 DIAGNOSIS — Z6831 Body mass index (BMI) 31.0-31.9, adult: Secondary | ICD-10-CM | POA: Diagnosis not present

## 2022-12-30 DIAGNOSIS — R109 Unspecified abdominal pain: Secondary | ICD-10-CM | POA: Diagnosis not present

## 2022-12-30 DIAGNOSIS — R112 Nausea with vomiting, unspecified: Secondary | ICD-10-CM | POA: Diagnosis not present

## 2022-12-30 DIAGNOSIS — F1210 Cannabis abuse, uncomplicated: Secondary | ICD-10-CM | POA: Diagnosis not present

## 2022-12-30 DIAGNOSIS — R1110 Vomiting, unspecified: Secondary | ICD-10-CM | POA: Diagnosis not present

## 2022-12-30 DIAGNOSIS — F17210 Nicotine dependence, cigarettes, uncomplicated: Secondary | ICD-10-CM | POA: Diagnosis not present

## 2022-12-30 DIAGNOSIS — K2100 Gastro-esophageal reflux disease with esophagitis, without bleeding: Secondary | ICD-10-CM | POA: Diagnosis not present

## 2022-12-30 DIAGNOSIS — R1084 Generalized abdominal pain: Secondary | ICD-10-CM | POA: Diagnosis not present

## 2023-01-05 DIAGNOSIS — Z419 Encounter for procedure for purposes other than remedying health state, unspecified: Secondary | ICD-10-CM | POA: Diagnosis not present

## 2023-01-26 ENCOUNTER — Ambulatory Visit: Payer: Medicaid Other | Admitting: Nurse Practitioner

## 2023-01-26 ENCOUNTER — Encounter: Payer: Self-pay | Admitting: Nurse Practitioner

## 2023-01-26 VITALS — BP 122/82 | HR 80 | Temp 97.5°F | Ht 71.0 in | Wt 340.8 lb

## 2023-01-26 DIAGNOSIS — F1210 Cannabis abuse, uncomplicated: Secondary | ICD-10-CM | POA: Insufficient documentation

## 2023-01-26 DIAGNOSIS — L732 Hidradenitis suppurativa: Secondary | ICD-10-CM

## 2023-01-26 DIAGNOSIS — Z0000 Encounter for general adult medical examination without abnormal findings: Secondary | ICD-10-CM | POA: Diagnosis not present

## 2023-01-26 DIAGNOSIS — J069 Acute upper respiratory infection, unspecified: Secondary | ICD-10-CM | POA: Diagnosis not present

## 2023-01-26 DIAGNOSIS — Z0001 Encounter for general adult medical examination with abnormal findings: Secondary | ICD-10-CM | POA: Diagnosis not present

## 2023-01-26 DIAGNOSIS — Z7689 Persons encountering health services in other specified circumstances: Secondary | ICD-10-CM | POA: Insufficient documentation

## 2023-01-26 DIAGNOSIS — R6889 Other general symptoms and signs: Secondary | ICD-10-CM | POA: Diagnosis not present

## 2023-01-26 LAB — PREGNANCY, URINE: Preg Test, Ur: NEGATIVE

## 2023-01-26 LAB — BAYER DCA HB A1C WAIVED: HB A1C (BAYER DCA - WAIVED): 5.1 % (ref 4.8–5.6)

## 2023-01-26 MED ORDER — CLINDAMYCIN PHOSPHATE 1 % EX GEL
Freq: Two times a day (BID) | CUTANEOUS | 0 refills | Status: DC
Start: 2023-01-26 — End: 2023-01-29

## 2023-01-26 MED ORDER — CLINDAMYCIN PHOSPHATE 1 % EX SOLN: Freq: Two times a day (BID) | CUTANEOUS | 0 refills | Status: DC

## 2023-01-26 NOTE — Progress Notes (Signed)
Established Patient Office Visit  Subjective   Patient ID: Peggy Rodriguez, female    DOB: [birth year]  Age: 37 y.o. MRN: [id number]  Chief Complaint  Patient presents with   Establish Care   Weight Loss    Wants to discuss possible medication for weight loss   Annual Exam   HPI Peggy Rodriguez is a 37 yrs old female  with PMH of Hidradenitis, OSA, cannabis, and obesity c/o of hidradenitis outbreak in her groin area  Hidradenitis Chronic condition Reports worsening discomfort and distress due to a recent onset of painful boils and abscesses primarily in the groin and underarms. She first noticed small, tender bumps about three weeks ago, which have progressively enlarged and become more painful. Despite trying warm compresses at home, the symptoms have persisted and intensified, prompting her to seek medical attention today. Last outbreak was 1 yr ago and treated with Clindamycin which was very effective She reports no significant past medical history related to skin conditions but expresses concern about the impact on her daily life and emotional well-being. Patient denies any recent changes in medications or known allergies relevant to treatment.    Weight Concerns  She has  ongoing concerns regarding obesity management. Her currently weighs 340 lbs and despite consistent efforts with diet modifications and regular exercise, She reports minimal to no significant weight loss over the past several months. She describes following a structured diet plan with reduced calorie intake and engaging in moderate exercise routines three times per week. However, these efforts have not yielded the desired results, leading to frustration and a desire to explore other treatment options. Patient is interested in discussing the possibility of weight loss medications to aid in achieving sustainable weight reduction goals. Denies any recent changes in medical conditions or medications that might impact his weight.  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   Xanthelasma of eyelid, bilateral 01/08/2021   Hydradenitis 02/27/2020   Obesity, Class III, BMI 40-49.9 (morbid obesity) (HCC) 02/26/2020   Past Medical History:  Diagnosis Date   Colon perforation (HCC)    Diverticulitis    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   Past Surgical History:  Procedure Laterality Date   BARTHOLIN GLAND CYST EXCISION  05/2013   CHOLECYSTECTOMY  2013   KNEE SURGERY Right 1996   Cyst removal   Social History   Tobacco Use   Smoking status: Some Days    Current packs/day: 0.15    Types: Cigarettes   Smokeless tobacco: Never  Vaping Use   Vaping status: Never Used  Substance Use Topics   Alcohol use: Yes    Comment: socially   Drug use: Not Currently    Types: Marijuana, Cocaine    Comment: patient states she quit x 4 months ago, cocaine 2018   Social History   Socioeconomic History   Marital status: Single    Spouse name: Not on file   Number of children: Not on file   Years of education: Not on file  Highest education level: Not on file  Occupational History   Not on file  Tobacco Use   Smoking status: Some Days    Current packs/day: 0.15    Types: Cigarettes   Smokeless tobacco: Never  Vaping Use   Vaping status: Never Used  Substance and Sexual Activity   Alcohol use: Yes    Comment: socially   Drug use: Not Currently    Types: Marijuana, Cocaine    Comment: patient states she quit x 4 months ago, cocaine 2018   Sexual activity:  Yes    Birth control/protection: Condom, Implant  Other Topics Concern   Not on file  Social History Narrative   Caffiene, 2 cups daily.    Lives home with daughter, works at [employer].    Some college.    Social Determinants of Health   Financial Resource Strain: Not on file  Food Insecurity: No Food Insecurity (01/26/2023)   Hunger Vital Sign    Worried About Running Out of Food in the Last Year: Never true    Ran Out of Food in the Last Year: Never true  Transportation Needs: No Transportation Needs (01/26/2023)   PRAPARE - Administrator, Civil Service (Medical): No    Lack of Transportation (Non-Medical): No  Physical Activity: Not on file  Stress: Not on file  Social Connections: Not on file  Intimate Partner Violence: Not At Risk (01/26/2023)   Humiliation, Afraid, Rape, and Kick questionnaire    Fear of Current or Ex-Partner: No    Emotionally Abused: No    Physically Abused: No    Sexually Abused: No   Family Status  Relation Name Status   Mother  Alive   Father  (Not Specified)   Sister  Alive   Brother  Alive   MGM  Alive   MGF  Deceased   PGM  Deceased   PGF  Deceased   Daughter  Alive  No partnership data on file   Family History  Problem Relation Age of Onset   Diabetes Mother    Hypertension Mother    Coronary artery disease Mother    Thyroid disease Mother    Hearing loss Father    Other Sister        immune thrombocytopenic purpura   Lymphoma Sister    Immunodeficiency Sister    Hypertension Maternal Grandmother    Thyroid cancer Maternal Grandmother    Kidney cancer Maternal Grandmother    Stomach cancer Maternal Grandmother    Diabetes Maternal Grandmother    Breast cancer Maternal Grandmother    Thyroid cancer Maternal Grandfather    No Known Allergies    Review of Systems  Constitutional:  Negative for chills, fever and weight loss.  Eyes:  Negative for double vision and pain.  Respiratory:   Negative for shortness of breath.   Cardiovascular:  Negative for chest pain and palpitations.  Gastrointestinal:  Negative for constipation, diarrhea and nausea.  Genitourinary:  Negative for urgency.  Musculoskeletal:  Negative for falls, joint pain and myalgias.  Neurological:  Negative for dizziness and weakness.  Endo/Heme/Allergies:  Negative for polydipsia. Does not bruise/bleed easily.  Psychiatric/Behavioral:  Negative for hallucinations.    Negative unless indicated in HPI   Objective:     BP 122/82   Pulse 80   Temp (!) 97.5 F (36.4 C) (Temporal)   Ht 5\' 11"  (1.803 m)   Wt (!) 340 lb 12.8 oz (154.6 kg)  SpO2 97%   BMI 47.53 kg/m  BP Readings from Last 3 Encounters:  01/26/23 122/82  05/06/22 132/78  03/26/22 128/79   Wt Readings from Last 3 Encounters:  01/26/23 (!) 340 lb 12.8 oz (154.6 kg)  05/06/22 (!) 334 lb 12.8 oz (151.9 kg)  03/26/22 (!) 329 lb (149.2 kg)      Physical Exam Vitals and nursing note reviewed.  Constitutional:      Appearance: Normal appearance. She is obese.  HENT:     Head: Normocephalic and atraumatic.  Eyes:     General: No scleral icterus.    Extraocular Movements: Extraocular movements intact.     Conjunctiva/sclera: Conjunctivae normal.     Pupils: Pupils are equal, round, and reactive to light.  Neck:     Vascular: No carotid bruit.  Cardiovascular:     Rate and Rhythm: Normal rate and regular rhythm.  Pulmonary:     Effort: Pulmonary effort is normal.     Breath sounds: Normal breath sounds.  Abdominal:     General: Bowel sounds are normal. There is distension.     Palpations: There is no mass.     Tenderness: There is no abdominal tenderness. There is no rebound.     Hernia: No hernia is present.  Musculoskeletal:        General: No swelling or tenderness. Normal range of motion.     Cervical back: Normal range of motion and neck supple. No rigidity.     Right lower leg: No edema.     Left lower leg: No edema.   Skin:    General: Skin is warm and dry.     Findings: No lesion.  Neurological:     General: No focal deficit present.     Mental Status: She is alert and oriented to person, place, and time. Mental status is at baseline.  Psychiatric:        Mood and Affect: Mood normal.        Behavior: Behavior normal.        Thought Content: Thought content normal.        Judgment: Judgment normal.      No results found for any visits on 01/26/23.  Last CBC Lab Results  Component Value Date   WBC 7.9 03/27/2022   HGB 12.9 03/27/2022   HCT 40.5 03/27/2022   MCV 84 03/27/2022   MCH 26.8 03/27/2022   RDW 13.9 03/27/2022   PLT 347 03/27/2022   Last metabolic panel Lab Results  Component Value Date   GLUCOSE 91 03/27/2022   NA 136 03/27/2022   K 4.5 03/27/2022   CL 98 03/27/2022   CO2 21 03/27/2022   BUN 9 03/27/2022   CREATININE 0.75 03/27/2022   EGFR 106 03/27/2022   CALCIUM 9.3 03/27/2022   PROT 7.2 03/27/2022   ALBUMIN 4.5 03/27/2022   LABGLOB 2.7 03/27/2022   AGRATIO 1.7 03/27/2022   BILITOT 0.3 03/27/2022   ALKPHOS 82 03/27/2022   AST 15 03/27/2022   ALT 15 03/27/2022   ANIONGAP 8 02/29/2020   Last lipids Lab Results  Component Value Date   CHOL 161 03/27/2022   HDL 61 03/27/2022   LDLCALC 85 03/27/2022   TRIG 78 03/27/2022   CHOLHDL 2.6 03/27/2022   Last hemoglobin A1c Lab Results  Component Value Date   HGBA1C 4.8 01/08/2021   Last thyroid functions Lab Results  Component Value Date   TSH 2.180 03/27/2022        Assessment &  Plan:  Routine medical exam -     CBC with Differential/Platelet -     CMP14+EGFR -     Lipid panel -     Thyroid Panel With TSH -     Hepatitis C antibody -     Pregnancy, urine -     Bayer DCA Hb A1c Waived  Cannabis abuse  Hydradenitis -     Clindamycin Phosphate; Apply topically 2 (two) times daily.  Dispense: 30 g; Refill: 0  Obesity, Class III, BMI 40-49.9 (morbid obesity) (HCC)  Upper respiratory tract  infection, unspecified type  Winnie Umali is a 37 yrs old African American female obese female in no acute distress Hidradenitis: topical Clindamycin BID to clean area Weight loss: continue diet and exercise Client to call insurance to inquire about obesity OSA: continue using CPAP Labs: CBC, CMP, Lipid, TSH, A1c   Return in about 6 months (around 07/29/2023).    Arrie Aran Santa Lighter, DNP [facility] [address] [place], [place] [address] [phone]

## 2023-01-27 LAB — CMP14+EGFR
ALT: 17 IU/L (ref 0–32)
AST: 13 IU/L (ref 0–40)
Albumin: 4 g/dL (ref 3.9–4.9)
Alkaline Phosphatase: 87 IU/L (ref 44–121)
BUN/Creatinine Ratio: 10 (ref 9–23)
BUN: 8 mg/dL (ref 6–20)
Bilirubin Total: 0.3 mg/dL (ref 0.0–1.2)
CO2: 23 mmol/L (ref 20–29)
Calcium: 9.1 mg/dL (ref 8.7–10.2)
Chloride: 103 mmol/L (ref 96–106)
Creatinine, Ser: 0.81 mg/dL (ref 0.57–1.00)
Globulin, Total: 2.6 g/dL (ref 1.5–4.5)
Glucose: 86 mg/dL (ref 70–99)
Potassium: 4.7 mmol/L (ref 3.5–5.2)
Sodium: 138 mmol/L (ref 134–144)
Total Protein: 6.6 g/dL (ref 6.0–8.5)
eGFR: 96 mL/min/{1.73_m2} (ref >59)

## 2023-01-27 LAB — CBC WITH DIFFERENTIAL/PLATELET
Basophils Absolute: 0 10*3/uL (ref 0.0–0.2)
Basos: 0 %
EOS (ABSOLUTE): 0.3 10*3/uL (ref 0.0–0.4)
Eos: 4 %
Hematocrit: 37.3 % (ref 34.0–46.6)
Hemoglobin: 11.9 g/dL (ref 11.1–15.9)
Immature Grans (Abs): 0 10*3/uL (ref 0.0–0.1)
Immature Granulocytes: 0 %
Lymphocytes Absolute: 3 10*3/uL (ref 0.7–3.1)
Lymphs: 40 %
MCH: 27 pg (ref 26.6–33.0)
MCHC: 31.9 g/dL (ref 31.5–35.7)
MCV: 85 fL (ref 79–97)
Monocytes Absolute: 0.4 10*3/uL (ref 0.1–0.9)
Monocytes: 6 %
Neutrophils Absolute: 3.8 10*3/uL (ref 1.4–7.0)
Neutrophils: 50 %
Platelets: 335 10*3/uL (ref 150–450)
RBC: 4.4 x10E6/uL (ref 3.77–5.28)
RDW: 14.3 % (ref 11.7–15.4)
WBC: 7.6 10*3/uL (ref 3.4–10.8)

## 2023-01-27 LAB — THYROID PANEL WITH TSH
Free Thyroxine Index: 1.8 (ref 1.2–4.9)
T3 Uptake Ratio: 24 % (ref 24–39)
T4, Total: 7.3 ug/dL (ref 4.5–12.0)
TSH: 1.89 u[IU]/mL (ref 0.450–4.500)

## 2023-01-27 LAB — LIPID PANEL
Chol/HDL Ratio: 3.2 ratio (ref 0.0–4.4)
Cholesterol, Total: 162 mg/dL (ref 100–199)
HDL: 51 mg/dL (ref >39)
LDL Chol Calc (NIH): 95 mg/dL (ref 0–99)
Triglycerides: 88 mg/dL (ref 0–149)
VLDL Cholesterol Cal: 16 mg/dL (ref 5–40)

## 2023-01-27 LAB — HEPATITIS C ANTIBODY: Hep C Virus Ab: NONREACTIVE

## 2023-01-29 ENCOUNTER — Telehealth: Payer: Self-pay

## 2023-01-29 ENCOUNTER — Other Ambulatory Visit: Payer: Self-pay | Admitting: Nurse Practitioner

## 2023-01-29 DIAGNOSIS — L732 Hidradenitis suppurativa: Secondary | ICD-10-CM

## 2023-01-29 MED ORDER — ADAPALENE 0.1 % EX CREA
TOPICAL_CREAM | Freq: Every day | CUTANEOUS | 2 refills | Status: AC
Start: 2023-01-29 — End: ?

## 2023-01-29 NOTE — Telephone Encounter (Signed)
Clindamycin gel is not covered by Medicaid.  Covered alternatives are:  adapalene / benzoyl peroxide (generic for Epiduo  Forte)  adapalene / benzoyl peroxide  adapalene gel pump  adapalene cream / gel    azelaic acid gel   clindamycin phosphate pledgets / solution   clindamycin-benzoyl peroxide gel   erythromycin gel    Erythromycin solution  erythromycin-benzoyl peroxide gel  Finacea gel  Retin-A Cream/Gel  Retin-A Micro gel

## 2023-01-29 NOTE — Telephone Encounter (Signed)
Pt aware and verbalized understanding.  

## 2023-02-05 DIAGNOSIS — Z419 Encounter for procedure for purposes other than remedying health state, unspecified: Secondary | ICD-10-CM | POA: Diagnosis not present

## 2023-03-08 DIAGNOSIS — Z419 Encounter for procedure for purposes other than remedying health state, unspecified: Secondary | ICD-10-CM | POA: Diagnosis not present

## 2023-03-16 DIAGNOSIS — F17210 Nicotine dependence, cigarettes, uncomplicated: Secondary | ICD-10-CM | POA: Diagnosis not present

## 2023-03-16 DIAGNOSIS — N764 Abscess of vulva: Secondary | ICD-10-CM | POA: Diagnosis not present

## 2023-03-16 DIAGNOSIS — Z96659 Presence of unspecified artificial knee joint: Secondary | ICD-10-CM | POA: Diagnosis not present

## 2023-03-16 DIAGNOSIS — L02416 Cutaneous abscess of left lower limb: Secondary | ICD-10-CM | POA: Diagnosis not present

## 2023-03-16 DIAGNOSIS — E669 Obesity, unspecified: Secondary | ICD-10-CM | POA: Diagnosis not present

## 2023-03-16 DIAGNOSIS — Z6841 Body Mass Index (BMI) 40.0 and over, adult: Secondary | ICD-10-CM | POA: Diagnosis not present

## 2023-04-07 DIAGNOSIS — Z419 Encounter for procedure for purposes other than remedying health state, unspecified: Secondary | ICD-10-CM | POA: Diagnosis not present

## 2023-04-20 NOTE — Progress Notes (Deleted)
Patient: Peggy Rodriguez Date of Birth: Oct 08, 1985  Reason for Visit: Follow up History from: Patient Primary Neurologist: Peggy Rodriguez   ASSESSMENT AND PLAN 37 y.o. year old female   1.  Severe OSA   HISTORY OF PRESENT ILLNESS: Today 04/20/23   HISTORY  UPDATE 10/01/22 SS: Here today for initial CPAP visit.  Had reported snoring and excessive daytime somnolence.  Had split-night sleep study 06/09/2022 showing severe OSA had significant improvement with CPAP. The baseline AHI was 40.5/h, O2 nadir 69%.  CPAP of 9 cm resulted in significant reduction of her sleep disordered breathing. Review of CPAP data 09/01/2022-09/30/2022 shows usage 25/30 days at 83%, greater than 4 hours 19 days at 63%.  Average usage days used 5 hours 49 minutes.  Set pressure 9 cmH2O.  EPR level 2.  Leak 39.8.  AHI 5.3. is using medium mask res med full face. Notices certain positions of sleep at night affect her sleep data. When wearing CPAP, notices the next day feel more energy, not falling asleep during the day, not requiring daytime nap.  Often times she falls asleep before putting CPAP on.   HISTORY  05/06/22 Dr.  Frances Rodriguez: I saw your patient, Peggy Rodriguez, upon your kind request in my sleep clinic to for initial consultation of her sleep disorder, in particular, concern for underlying obstructive sleep apnea.  The patient is unaccompanied today.  As you know, Peggy Rodriguez is a 37 year old female with an underlying medical history of diverticulitis, allergies, and morbid obesity with a BMI of over 45, who reports snoring and excessive daytime somnolence.  I reviewed your office note from 03/26/2022.  Her Epworth sleepiness score is 17 out of 24, fatigue severity score is 30 out of 63. She reports a prior diagnosis of obstructive sleep apnea in 2007, study was done in [place], [place], she has not been on PAP therapy.  She admits that she did not pursue it at the time, as she recalls, she had moderate sleep apnea at the  time.  She was able to lose quite a bit of weight a few years ago in the realm of 80 pounds but in the past couple of years she has gained about 30 pounds back.  She is single, she lives with her 43 year old daughter who has witnessed her apneas from time to time.  Patient denies any recurrent morning headaches but has nocturia about once per average night.  She has an uncle with sleep apnea, on mom's side.  She drinks caffeine in the form of hot tea, usually 1 cup/day and 1 can of soda per day.  She drinks alcohol very occasionally, maybe once a month, she quit smoking some 4 months ago.  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  They have a [place] pig in the household, no cat or dog.  She does not have a TV in her bedroom currently.  She has been suffering from allergies and switch from Zyrtec to Claritin recently.  Bedtime is generally around 10 PM and rise time around 7:30 AM.  REVIEW OF SYSTEMS: Out of a complete 14 system review of symptoms, the patient complains only of the following symptoms, and all other reviewed systems are negative.  See HPI  ALLERGIES: No Known Allergies  HOME MEDICATIONS: Outpatient Medications Prior to Visit  Medication Sig Dispense Refill   adapalene (DIFFERIN) 0.1 % cream Apply  topically at bedtime. 45 g 2   etonogestrel (NEXPLANON) 68 MG IMPL implant 1 each by Subdermal route once.     No facility-administered medications prior to visit.    PAST MEDICAL HISTORY: Past Medical History:  Diagnosis Date   Colon perforation (HCC)    Diverticulitis    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    PAST SURGICAL HISTORY: Past Surgical History:  Procedure Laterality Date   BARTHOLIN GLAND CYST EXCISION  05/2013   CHOLECYSTECTOMY  2013   KNEE SURGERY Right 1996   Cyst removal    FAMILY HISTORY: Family History  Problem Relation Age of Onset   Diabetes Mother    Hypertension  Mother    Coronary artery disease Mother    Thyroid disease Mother    Hearing loss Father    Other Sister        immune thrombocytopenic purpura   Lymphoma Sister    Immunodeficiency Sister    Hypertension Maternal Grandmother    Thyroid cancer Maternal Grandmother    Kidney cancer Maternal Grandmother    Stomach cancer Maternal Grandmother    Diabetes Maternal Grandmother    Breast cancer Maternal Grandmother    Thyroid cancer Maternal Grandfather     SOCIAL HISTORY: Social History   Socioeconomic History   Marital status: Single    Spouse name: Not on file   Number of children: Not on file   Years of education: Not on file   Highest education level: Not on file  Occupational History   Not on file  Tobacco Use   Smoking status: Some Days    Current packs/day: 0.15    Types: Cigarettes   Smokeless tobacco: Never  Vaping Use   Vaping status: Never Used  Substance and Sexual Activity   Alcohol use: Yes    Comment: socially   Drug use: Not Currently    Types: Marijuana, Cocaine    Comment: patient states she quit x 4 months ago, cocaine 2018   Sexual activity: Yes    Birth control/protection: Condom, Implant  Other Topics Concern   Not on file  Social History Narrative   Caffiene, 2 cups daily.    Lives home with daughter, works at [employer].    Some college.    Social Determinants of Health   Financial Resource Strain: Not on file  Food Insecurity: No Food Insecurity (01/26/2023)   Hunger Vital Sign    Worried About Running Out of Food in the Last Year: Never true    Ran Out of Food in the Last Year: Never true  Transportation Needs: No Transportation Needs (01/26/2023)   PRAPARE - Administrator, Civil Service (Medical): No    Lack of Transportation (Non-Medical): No  Physical Activity: Not on file  Stress: Not on file  Social Connections: Not on file  Intimate Partner Violence: Not At Risk (01/26/2023)   Humiliation,  Afraid, Rape, and Kick questionnaire    Fear of Current or Ex-Partner: No    Emotionally Abused: No    Physically Abused: No    Sexually Abused: No    PHYSICAL EXAM  There were no vitals filed for this visit. There is no height or weight on file to calculate BMI.  Generalized: Well developed, in no acute distress  Neurological examination  Mentation: Alert oriented to time, place, history taking. Follows all commands speech and language fluent Cranial nerve II-XII: Pupils were equal round reactive to light. Extraocular movements were full, visual  field were full on confrontational test. Facial sensation and strength were normal. Uvula tongue midline. Head turning and shoulder shrug  were normal and symmetric. Motor: The motor testing reveals 5 over 5 strength of all 4 extremities. Good symmetric motor tone is noted throughout.  Sensory: Sensory testing is intact to soft touch on all 4 extremities. No evidence of extinction is noted.  Coordination: Cerebellar testing reveals good finger-nose-finger and heel-to-shin bilaterally.  Gait and station: Gait is normal. Tandem gait is normal. Romberg is negative. No drift is seen.  Reflexes: Deep tendon reflexes are symmetric and normal bilaterally.   DIAGNOSTIC DATA (LABS, IMAGING, TESTING) - I reviewed patient records, labs, notes, testing and imaging myself where available.  Lab Results  Component Value Date   WBC 7.6 01/26/2023   HGB 11.9 01/26/2023   HCT 37.3 01/26/2023   MCV 85 01/26/2023   PLT 335 01/26/2023      Component Value Date/Time   NA 138 01/26/2023 1506   K 4.7 01/26/2023 1506   CL 103 01/26/2023 1506   CO2 23 01/26/2023 1506   GLUCOSE 86 01/26/2023 1506   GLUCOSE 91 02/29/2020 0649   BUN 8 01/26/2023 1506   CREATININE 0.81 01/26/2023 1506   CALCIUM 9.1 01/26/2023 1506   PROT 6.6 01/26/2023 1506   ALBUMIN 4.0 01/26/2023 1506   AST 13 01/26/2023 1506   ALT 17 01/26/2023 1506   ALKPHOS 87 01/26/2023 1506    BILITOT 0.3 01/26/2023 1506   GFRNONAA >60 02/29/2020 0649   GFRAA >60 02/29/2020 0649   Lab Results  Component Value Date   CHOL 162 01/26/2023   HDL 51 01/26/2023   LDLCALC 95 01/26/2023   TRIG 88 01/26/2023   CHOLHDL 3.2 01/26/2023   Lab Results  Component Value Date   HGBA1C 5.1 01/26/2023   Lab Results  Component Value Date   VITAMINB12 415 01/08/2021   Lab Results  Component Value Date   TSH 1.890 01/26/2023    Margie Ege, AGNP-C, DNP 04/20/2023, 9:20 PM [facility] [address], [address] [place], [place] [address] [phone]

## 2023-04-21 ENCOUNTER — Ambulatory Visit: Payer: Medicaid Other | Admitting: Neurology

## 2023-04-21 ENCOUNTER — Encounter: Payer: Self-pay | Admitting: Neurology

## 2023-05-08 DIAGNOSIS — Z419 Encounter for procedure for purposes other than remedying health state, unspecified: Secondary | ICD-10-CM | POA: Diagnosis not present

## 2023-06-07 DIAGNOSIS — Z419 Encounter for procedure for purposes other than remedying health state, unspecified: Secondary | ICD-10-CM | POA: Diagnosis not present

## 2023-07-08 DIAGNOSIS — Z419 Encounter for procedure for purposes other than remedying health state, unspecified: Secondary | ICD-10-CM | POA: Diagnosis not present

## 2023-07-26 NOTE — Progress Notes (Deleted)
Established Patient Office Visit  Subjective  Patient ID: Peggy Rodriguez, female    DOB: 1986/01/05  Age: 38 y.o. MRN: [id number]  No chief complaint on file.   HPI  Patient Active Problem List   Diagnosis Date Noted   Encounter to establish care 01/26/2023   Routine medical exam 01/26/2023   Cannabis abuse 01/26/2023   OSA on CPAP 03/27/2022   Other intraarticular fracture of lower end of right radius, initial encounter for closed fracture 10/16/2021   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   Xanthelasma of eyelid, bilateral 01/08/2021   Hydradenitis 02/27/2020   Obesity, Class III, BMI 40-49.9 (morbid obesity) (HCC) 02/26/2020   Past Medical History:  Diagnosis Date   Colon perforation (HCC)    Diverticulitis    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   Past Surgical History:  Procedure Laterality Date   BARTHOLIN GLAND CYST EXCISION  05/2013   CHOLECYSTECTOMY  2013   KNEE SURGERY Right 1996   Cyst removal   Social History   Tobacco Use   Smoking status: Some Days    Current packs/day: 0.15    Types: Cigarettes   Smokeless tobacco: Never  Vaping Use   Vaping status: Never Used  Substance Use Topics   Alcohol use: Yes    Comment: socially   Drug use: Not Currently    Types: Marijuana, Cocaine    Comment: patient states she quit x 4 months ago, cocaine 2018   Social History   Socioeconomic History   Marital status: Single    Spouse name: Not on file   Number of children: Not on file   Years of education: Not on file   Highest education level: Not on file  Occupational History   Not on file  Tobacco Use   Smoking status: Some Days    Current packs/day: 0.15    Types: Cigarettes   Smokeless tobacco: Never  Vaping Use   Vaping status: Never Used  Substance and Sexual Activity   Alcohol use: Yes    Comment: socially   Drug use: Not Currently    Types: Marijuana, Cocaine    Comment: patient states she quit x 4 months ago, cocaine 2018   Sexual activity: Yes    Birth  control/protection: Condom, Implant  Other Topics Concern   Not on file  Social History Narrative   Caffiene, 2 cups daily.    Lives home with daughter, works at [employer].    Some college.    Social Drivers of Corporate investment banker Strain: Not on file  Food Insecurity: No Food Insecurity (01/26/2023)   Hunger Vital Sign    Worried About Running Out of Food in the Last Year: Never true    Ran Out of Food in the Last Year: Never true  Transportation Needs: No Transportation Needs (01/26/2023)   PRAPARE - Administrator, Civil Service (Medical): No    Lack of Transportation (Non-Medical): No  Physical Activity: Not on file  Stress: Not on file  Social Connections: Not on file  Intimate Partner Violence: Not At Risk (01/26/2023)   Humiliation, Afraid, Rape, and Kick questionnaire    Fear of Current or Ex-Partner: No    Emotionally Abused: No    Physically Abused: No    Sexually Abused: No   Family Status  Relation Name Status   Mother  Alive   Father  (Not Specified)   Sister  Alive   Brother  Alive  MGM  Alive   MGF  Deceased   PGM  Deceased   PGF  Deceased   Daughter  Alive  No partnership data on file   Family History  Problem Relation Age of Onset   Diabetes Mother    Hypertension Mother    Coronary artery disease Mother    Thyroid disease Mother    Hearing loss Father    Other Sister        immune thrombocytopenic purpura   Lymphoma Sister    Immunodeficiency Sister    Hypertension Maternal Grandmother    Thyroid cancer Maternal Grandmother    Kidney cancer Maternal Grandmother    Stomach cancer Maternal Grandmother    Diabetes Maternal Grandmother    Breast cancer Maternal Grandmother    Thyroid cancer Maternal Grandfather    No Known Allergies    ROS Negative unless indicated in HPI   Objective:     There were no vitals taken for this visit. BP Readings from Last 3 Encounters:  01/26/23 122/82   05/06/22 132/78  03/26/22 128/79   Wt Readings from Last 3 Encounters:  01/26/23 (!) 340 lb 12.8 oz (154.6 kg)  05/06/22 (!) 334 lb 12.8 oz (151.9 kg)  03/26/22 (!) 329 lb (149.2 kg)      Physical Exam   No results found for any visits on 07/29/23.  Last CBC Lab Results  Component Value Date   WBC 7.6 01/26/2023   HGB 11.9 01/26/2023   HCT 37.3 01/26/2023   MCV 85 01/26/2023   MCH 27.0 01/26/2023   RDW 14.3 01/26/2023   PLT 335 01/26/2023   Last metabolic panel Lab Results  Component Value Date   GLUCOSE 86 01/26/2023   NA 138 01/26/2023   K 4.7 01/26/2023   CL 103 01/26/2023   CO2 23 01/26/2023   BUN 8 01/26/2023   CREATININE 0.81 01/26/2023   EGFR 96 01/26/2023   CALCIUM 9.1 01/26/2023   PROT 6.6 01/26/2023   ALBUMIN 4.0 01/26/2023   LABGLOB 2.6 01/26/2023   AGRATIO 1.7 03/27/2022   BILITOT 0.3 01/26/2023   ALKPHOS 87 01/26/2023   AST 13 01/26/2023   ALT 17 01/26/2023   ANIONGAP 8 02/29/2020   Last lipids Lab Results  Component Value Date   CHOL 162 01/26/2023   HDL 51 01/26/2023   LDLCALC 95 01/26/2023   TRIG 88 01/26/2023   CHOLHDL 3.2 01/26/2023   Last hemoglobin A1c Lab Results  Component Value Date   HGBA1C 5.1 01/26/2023   Last thyroid functions Lab Results  Component Value Date   TSH 1.890 01/26/2023   T4TOTAL 7.3 01/26/2023        Assessment & Plan:  There are no diagnoses linked to this encounter. Continue healthy lifestyle choices, including diet (rich in fruits, vegetables, and lean proteins, and low in salt and simple carbohydrates) and exercise (at least 30 minutes of moderate physical activity daily).     The above assessment and management plan was discussed with the patient. The patient verbalized understanding of and has agreed to the management plan. Patient is aware to call the clinic if they develop any new symptoms or if symptoms persist or worsen. Patient is aware when to return to the clinic for a follow-up visit.  Patient educated on when it is appropriate to go to the emergency department.  No follow-ups on file.   Arrie Aran Santa Lighter, DNP [facility] [address] [place], [place] [address] [phone]  Note: This document was prepared by [employer] voice dictation technology and any errors that results from this process are unintentional.

## 2023-07-29 ENCOUNTER — Ambulatory Visit: Payer: Medicaid Other | Admitting: Nurse Practitioner

## 2023-07-30 ENCOUNTER — Ambulatory Visit: Payer: Medicaid Other | Admitting: Nurse Practitioner

## 2023-08-08 DIAGNOSIS — Z419 Encounter for procedure for purposes other than remedying health state, unspecified: Secondary | ICD-10-CM | POA: Diagnosis not present

## 2023-09-05 DIAGNOSIS — Z419 Encounter for procedure for purposes other than remedying health state, unspecified: Secondary | ICD-10-CM | POA: Diagnosis not present

## 2023-10-17 DIAGNOSIS — Z419 Encounter for procedure for purposes other than remedying health state, unspecified: Secondary | ICD-10-CM | POA: Diagnosis not present

## 2023-11-16 DIAGNOSIS — Z419 Encounter for procedure for purposes other than remedying health state, unspecified: Secondary | ICD-10-CM | POA: Diagnosis not present

## 2023-12-17 DIAGNOSIS — Z419 Encounter for procedure for purposes other than remedying health state, unspecified: Secondary | ICD-10-CM | POA: Diagnosis not present

## 2024-01-16 DIAGNOSIS — Z419 Encounter for procedure for purposes other than remedying health state, unspecified: Secondary | ICD-10-CM | POA: Diagnosis not present

## 2024-02-16 DIAGNOSIS — Z419 Encounter for procedure for purposes other than remedying health state, unspecified: Secondary | ICD-10-CM | POA: Diagnosis not present

## 2024-03-18 DIAGNOSIS — Z419 Encounter for procedure for purposes other than remedying health state, unspecified: Secondary | ICD-10-CM | POA: Diagnosis not present

## 2024-04-01 DIAGNOSIS — Z833 Family history of diabetes mellitus: Secondary | ICD-10-CM | POA: Diagnosis not present

## 2024-04-01 DIAGNOSIS — M2530 Other instability, unspecified joint: Secondary | ICD-10-CM | POA: Diagnosis not present

## 2024-04-01 DIAGNOSIS — Z8249 Family history of ischemic heart disease and other diseases of the circulatory system: Secondary | ICD-10-CM | POA: Diagnosis not present

## 2024-04-01 DIAGNOSIS — R030 Elevated blood-pressure reading, without diagnosis of hypertension: Secondary | ICD-10-CM | POA: Diagnosis not present

## 2024-04-01 DIAGNOSIS — Z87891 Personal history of nicotine dependence: Secondary | ICD-10-CM | POA: Diagnosis not present

## 2024-04-01 DIAGNOSIS — M1990 Unspecified osteoarthritis, unspecified site: Secondary | ICD-10-CM | POA: Diagnosis not present

## 2024-04-01 DIAGNOSIS — N181 Chronic kidney disease, stage 1: Secondary | ICD-10-CM | POA: Diagnosis not present

## 2024-04-01 DIAGNOSIS — G4733 Obstructive sleep apnea (adult) (pediatric): Secondary | ICD-10-CM | POA: Diagnosis not present

## 2024-04-01 DIAGNOSIS — R0602 Shortness of breath: Secondary | ICD-10-CM | POA: Diagnosis not present

## 2024-04-17 DIAGNOSIS — Z419 Encounter for procedure for purposes other than remedying health state, unspecified: Secondary | ICD-10-CM | POA: Diagnosis not present

## 2024-06-27 DIAGNOSIS — Z6841 Body Mass Index (BMI) 40.0 and over, adult: Secondary | ICD-10-CM | POA: Diagnosis not present

## 2024-06-27 DIAGNOSIS — K529 Noninfective gastroenteritis and colitis, unspecified: Secondary | ICD-10-CM | POA: Diagnosis not present

## 2024-06-27 DIAGNOSIS — E669 Obesity, unspecified: Secondary | ICD-10-CM | POA: Diagnosis not present

## 2024-06-27 DIAGNOSIS — F17210 Nicotine dependence, cigarettes, uncomplicated: Secondary | ICD-10-CM | POA: Diagnosis not present
